# Patient Record
Sex: Male | Born: 1975 | Race: White | Hispanic: No | Marital: Married | Smoking: Current every day smoker
Health system: Southern US, Community
[De-identification: ages and names within clinical notes are randomized; demographics above are authoritative.]

## PROBLEM LIST (undated history)

## (undated) DIAGNOSIS — G4733 Obstructive sleep apnea (adult) (pediatric): Secondary | ICD-10-CM

## (undated) DIAGNOSIS — K219 Gastro-esophageal reflux disease without esophagitis: Secondary | ICD-10-CM

## (undated) DIAGNOSIS — J310 Chronic rhinitis: Secondary | ICD-10-CM

## (undated) DIAGNOSIS — Z720 Tobacco use: Secondary | ICD-10-CM

## (undated) DIAGNOSIS — M549 Dorsalgia, unspecified: Secondary | ICD-10-CM

## (undated) DIAGNOSIS — I10 Essential (primary) hypertension: Secondary | ICD-10-CM

## (undated) HISTORY — DX: Essential (primary) hypertension: I10

## (undated) HISTORY — DX: Tobacco use: Z72.0

## (undated) HISTORY — DX: Obstructive sleep apnea (adult) (pediatric): G47.33

## (undated) HISTORY — DX: Dorsalgia, unspecified: M54.9

## (undated) HISTORY — DX: Gastro-esophageal reflux disease without esophagitis: K21.9

## (undated) HISTORY — DX: Chronic rhinitis: J31.0

---

## 2002-01-30 ENCOUNTER — Encounter: Payer: Self-pay | Admitting: Emergency Medicine

## 2002-01-30 ENCOUNTER — Emergency Department (HOSPITAL_COMMUNITY): Admission: EM | Admit: 2002-01-30 | Discharge: 2002-01-30 | Payer: Self-pay | Admitting: Emergency Medicine

## 2005-11-09 ENCOUNTER — Ambulatory Visit: Payer: Self-pay | Admitting: Family Medicine

## 2005-12-09 ENCOUNTER — Ambulatory Visit: Payer: Self-pay | Admitting: Family Medicine

## 2010-05-22 ENCOUNTER — Ambulatory Visit: Payer: Self-pay | Admitting: Family Medicine

## 2010-05-22 DIAGNOSIS — J301 Allergic rhinitis due to pollen: Secondary | ICD-10-CM | POA: Insufficient documentation

## 2010-05-22 DIAGNOSIS — I10 Essential (primary) hypertension: Secondary | ICD-10-CM | POA: Insufficient documentation

## 2010-05-22 DIAGNOSIS — Z9189 Other specified personal risk factors, not elsewhere classified: Secondary | ICD-10-CM | POA: Insufficient documentation

## 2010-05-22 DIAGNOSIS — K219 Gastro-esophageal reflux disease without esophagitis: Secondary | ICD-10-CM | POA: Insufficient documentation

## 2010-05-23 LAB — CONVERTED CEMR LAB
Cholesterol: 203 mg/dL — ABNORMAL HIGH (ref 0–200)
Direct LDL: 141.3 mg/dL
Glucose, Bld: 95 mg/dL (ref 70–99)
HDL: 39.6 mg/dL (ref >39.00)
Total CHOL/HDL Ratio: 5
Triglycerides: 113 mg/dL (ref 0.0–149.0)
VLDL: 22.6 mg/dL (ref 0.0–40.0)

## 2010-09-09 NOTE — Assessment & Plan Note (Signed)
Summary: NEW PATIENT, EST / LFW   Vital Signs:  Patient profile:   35 year old male Height:      72 inches Weight:      264.25 pounds BMI:     35.97 Temp:     98.7 degrees F oral Pulse rate:   72 / minute Pulse rhythm:   regular BP sitting:   120 / 84  (left arm) Cuff size:   large  Vitals Entered By: Melody Comas (May 22, 2010 9:52 AM) CC: new patient   History of Present Illness: New patient. CPE.  See plan.    H/o possible OSA.  Prev ENT eval at [facility].  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  Needs new epipen rx.   FH of CV disease and needs glucose and chol.   Allergies (verified): 1)  ! * Bee Stings  Past History:  Past Medical History: GERD Hypertension- history of but off meds as of 2011  back pain- h/o ruptured disk, went through physical therapy  ~2005 h/o possible OSA, not on CPAP as of 2011 rhinitis, better with nasacort use  Past Surgical History: no surgical hx .  Family History: Reviewed history and no changes required. Family History Hypertension- Parents M alive F alive CVA, HTN GM with COPD, smoker 1 sister  Social History: Reviewed history and no changes required. Alcohol use-yes, occ, [employer] quit smoking 2010 Walking some for exercise From [facility], [employer] Works at [employer] since 2000 Married since 1999 No kids  Physical Exam  General:  GEN: nad, alert and oriented HEENT: mucous membranes moist NECK: supple w/o LA CV: rrr.  no murmur PULM: ctab, no inc wob ABD: soft, +bs EXT: no edema SKIN: no acute rash  17" neck   Impression & Recommendations:  Problem # 1:  Preventive Health Care (ICD-V70.0) healthy habits d/w patient.  Rec cutting out salt and fat.  Inc exercise.  Dec in weight.  Tdap <10 years per patient. Flu shot declined.  See notes on labs.  If snoring increases, then follow up here in clinic.  Not present consistently .  Problem # 2:  FAMILY HISTORY OF STROKE (ICD-V17.1) See notes on labs  and  above.  Orders: TLB-Lipid Panel ([id number])  Complete Medication List: 1)  Epipen 2-pak 0.3 Mg/0.6ml Devi (Epinephrine) .... Inject as directed. 2)  Nasacort Aq 55 Mcg/act Aers (Triamcinolone acetonide) .Marland Kitchen.. 1 spray per nostril per day  Other Orders: TLB-Glucose, QUANT ([id number])   Patient Instructions: 1)  Glad to see you.  Work on exercising more and cutting out salt.   2)  I sent your epipen to the pharmacy.  If you need it, use it and then immediately dial 911.   3)  Think about getting a flu shot.  4)  Let me know if the snoring gets worse.   5)  You can get your results through our phone system.  Follow the instructions on the blue card.  Prescriptions: EPIPEN 2-PAK 0.3 MG/0.3ML DEVI (EPINEPHRINE) inject as directed.  #1pack x 1   Entered and Authorized by:   Crawford Givens MD   Signed by:   Crawford Givens MD on 05/22/2010   Method used:   [facility] to        [facility] (retail)       [address]       [place], [place]  [address]       Ph: [phone] or [phone]  Fax: [phone]   RxID:   [id number]       Prior Medications (reviewed today): None Current Allergies (reviewed today): ! * BEE STINGS

## 2011-03-19 ENCOUNTER — Telehealth: Payer: Self-pay | Admitting: *Deleted

## 2011-03-19 MED ORDER — CIPROFLOXACIN HCL 500 MG PO TABS: 500.0000 mg | ORAL_TABLET | Freq: Two times a day (BID) | ORAL | Status: DC

## 2011-03-19 NOTE — Telephone Encounter (Signed)
Please call pt.  I sent the rx.

## 2011-03-19 NOTE — Telephone Encounter (Signed)
Patient is going to the [place] and is asking if he could get a rx for cipro for travelers diarrhea  in case he needs it. [facility].

## 2011-03-20 NOTE — Telephone Encounter (Signed)
Patient advised as instructed via telephone. 

## 2011-05-23 ENCOUNTER — Other Ambulatory Visit: Payer: Self-pay | Admitting: Family Medicine

## 2011-05-23 DIAGNOSIS — I10 Essential (primary) hypertension: Secondary | ICD-10-CM

## 2011-05-27 ENCOUNTER — Other Ambulatory Visit (INDEPENDENT_AMBULATORY_CARE_PROVIDER_SITE_OTHER): Payer: Self-pay

## 2011-05-27 DIAGNOSIS — I10 Essential (primary) hypertension: Secondary | ICD-10-CM

## 2011-05-27 LAB — GLUCOSE, RANDOM: Glucose, Bld: 96 mg/dL (ref 70–99)

## 2011-05-27 LAB — LIPID PANEL
Cholesterol: 213 mg/dL — ABNORMAL HIGH (ref 0–200)
HDL: 37.5 mg/dL — ABNORMAL LOW (ref >39.00)
Total CHOL/HDL Ratio: 6
Triglycerides: 155 mg/dL — ABNORMAL HIGH (ref 0.0–149.0)
VLDL: 31 mg/dL (ref 0.0–40.0)

## 2011-05-27 LAB — LDL CHOLESTEROL, DIRECT: Direct LDL: 153.9 mg/dL

## 2011-06-01 ENCOUNTER — Encounter: Payer: Self-pay | Admitting: Family Medicine

## 2011-06-01 ENCOUNTER — Ambulatory Visit (INDEPENDENT_AMBULATORY_CARE_PROVIDER_SITE_OTHER): Payer: 59 | Admitting: Family Medicine

## 2011-06-01 VITALS — BP 140/90 | HR 71 | Temp 98.2°F | Wt 264.1 lb

## 2011-06-01 DIAGNOSIS — IMO0001 Reserved for inherently not codable concepts without codable children: Secondary | ICD-10-CM

## 2011-06-01 DIAGNOSIS — Z23 Encounter for immunization: Secondary | ICD-10-CM

## 2011-06-01 DIAGNOSIS — Z720 Tobacco use: Secondary | ICD-10-CM

## 2011-06-01 DIAGNOSIS — I10 Essential (primary) hypertension: Secondary | ICD-10-CM | POA: Insufficient documentation

## 2011-06-01 DIAGNOSIS — Z0000 Encounter for general adult medical examination without abnormal findings: Secondary | ICD-10-CM

## 2011-06-01 DIAGNOSIS — Z87891 Personal history of nicotine dependence: Secondary | ICD-10-CM | POA: Insufficient documentation

## 2011-06-01 DIAGNOSIS — J301 Allergic rhinitis due to pollen: Secondary | ICD-10-CM

## 2011-06-01 MED ORDER — VARENICLINE TARTRATE 0.5 MG X 11 & 1 MG X 42 PO MISC: ORAL | Status: DC

## 2011-06-01 MED ORDER — EPINEPHRINE 0.3 MG/0.3ML IJ DEVI: INTRAMUSCULAR | Status: DC

## 2011-06-01 MED ORDER — FLUTICASONE PROPIONATE 50 MCG/ACT NA SUSP: 2.0000 | Freq: Every day | NASAL | Status: DC

## 2011-06-01 MED ORDER — VARENICLINE TARTRATE 1 MG PO TABS: 1.0000 mg | ORAL_TABLET | Freq: Two times a day (BID) | ORAL | Status: DC

## 2011-06-01 NOTE — Assessment & Plan Note (Signed)
Restart chantix, mood/dream cautions given.  He agrees.  Call back as needed.

## 2011-06-01 NOTE — Progress Notes (Signed)
CPE- See plan.  Routine anticipatory guidance given to patient.  See health maintenance.  Flu shot d/w pt.   Interested in getting off tobacco.  Some cigs, mostly dip.  Back on tobacco about 10 months.  First tobacco use after breakfast, when going to work.  1 can of dip a day.  Most likely to want it when at work.  "It keeps me calm."  Had prev quit.  Used chantix prev with relief.  Wants to use that again. Didn't have trouble with abnormal dreams (but he did have some vivid dreams); no mood changes.  No depressive sx.    H/o possible OSA.  Snoring some but not waking tired.  No napping, no nodding.  Weight unchanged.    H/o rhinitis, improved with nasal steroid. Needs refill.    PMH and SH reviewed  Meds, vitals, and allergies reviewed.   ROS: See HPI.  Otherwise negative.    GEN: nad, alert and oriented HEENT: mucous membranes moist, op w/o ulceration, nasal epithelium mildly injected NECK: supple w/o LA CV: rrr. PULM: ctab, no inc wob ABD: soft, +bs EXT: no edema SKIN: no acute rash

## 2011-06-01 NOTE — Assessment & Plan Note (Signed)
Restart nasal steroid and f/u prn. He agrees.

## 2011-06-01 NOTE — Assessment & Plan Note (Addendum)
tdap and flu today,  rx given for epipen.  Healthy habits encouraged.  D/w pt about diet/weight/exericse.  He wants to work on smoking first.  This is reasonable.  No indication for early colon/prostate CA screening.

## 2011-06-01 NOTE — Assessment & Plan Note (Signed)
Recheck BP 140/90, d/w pt about losing weight and diet/exericse.

## 2011-06-01 NOTE — Patient Instructions (Signed)
Try the chantix and let me know if you have concerns.  If the snoring gets worse, let me know.  Try to work on your weight after you stop smoking.  Glad to see you today.  Take care.

## 2011-12-08 ENCOUNTER — Telehealth: Payer: Self-pay

## 2011-12-08 NOTE — Telephone Encounter (Signed)
Pt seen UC across from [facility] for poison oak; given injection on 12/04/11. Pt BP 178/112. Pt said no h/a, no dizziness, no SOB, no chest pain. Pt only takes Zyrtec routinely. Pt feels normal now; came by after to work to get Dr Lianne Bushy advice. Dr Para March said ck BP now. BP 178/112 with large cuff now. Dr Para March said pt to get BP cuff and Wed and Thur when pt gets up take BP, urinate, rest and recheck BP. Pt to keep log and schedule appt with Dr Para March Friday 12/11/11 at 10:45 am.  If pt has any symptoms or does not feel normal  pt to notify Dr Para March and if at night go to urgent care or ER. Pt acknowledges instructions.

## 2011-12-09 NOTE — Telephone Encounter (Signed)
Agreed -

## 2011-12-11 ENCOUNTER — Encounter: Payer: Self-pay | Admitting: Family Medicine

## 2011-12-11 ENCOUNTER — Ambulatory Visit (INDEPENDENT_AMBULATORY_CARE_PROVIDER_SITE_OTHER): Payer: 59 | Admitting: Family Medicine

## 2011-12-11 VITALS — BP 152/100 | HR 80 | Temp 98.2°F | Wt 266.8 lb

## 2011-12-11 DIAGNOSIS — I10 Essential (primary) hypertension: Secondary | ICD-10-CM

## 2011-12-11 DIAGNOSIS — M25569 Pain in unspecified knee: Secondary | ICD-10-CM

## 2011-12-11 LAB — BASIC METABOLIC PANEL
BUN: 14 mg/dL (ref 6–23)
Chloride: 104 mEq/L (ref 96–112)
Creatinine, Ser: 0.9 mg/dL (ref 0.4–1.5)
Glucose, Bld: 97 mg/dL (ref 70–99)
Potassium: 4.6 mEq/L (ref 3.5–5.1)

## 2011-12-11 MED ORDER — HYDROCHLOROTHIAZIDE 12.5 MG PO TABS: 12.5000 mg | ORAL_TABLET | Freq: Every day | ORAL | Status: DC

## 2011-12-11 NOTE — Progress Notes (Signed)
Hypertension:    Not on meds BP elevated on mult checks Chest pain with exertion: no Edema:no Short of breath:no H/o OSA, not on CPAP.  Snoring more.  Waking up tired occ.  No AM HA.  No napping during the day.   He's working on diet but he isn't getting much exercise out of work.  He does have a physical job, but exercise limited o/w.   Weight is up 2 lbs in last 6 months.   We discussed salt load in food.   FH HTN.   He had stepped in a hole a few days ago, had some L knee pain but this is much improved, able to bear weight.    Meds, vitals, and allergies reviewed.   ROS: See HPI.  Otherwise negative.    GEN: nad, alert and oriented HEENT: mucous membranes moist NECK: supple w/o LA CV: rrr. PULM: ctab, no inc wob ABD: soft, +bs EXT: no edema SKIN: no acute rash L knee with normal inspection, not ttp on joint line and joint feels stable on testing ligaments.

## 2011-12-11 NOTE — Patient Instructions (Signed)
Go to the lab on the way out.  We'll contact you with your lab report. Start the HCTZ, 1 a day.   I'd like to see you back in a month, sooner if needed.   Look up the american heart association and the DASH diet.   Take care.

## 2011-12-14 DIAGNOSIS — M25562 Pain in left knee: Secondary | ICD-10-CM | POA: Insufficient documentation

## 2011-12-14 NOTE — Assessment & Plan Note (Signed)
D/w pt about path/phys.  Check bmet, start HCTZ, work on weight and diet. Recheck in 1 month.  D/wpt about OSA and HTN/weight.

## 2011-12-14 NOTE — Assessment & Plan Note (Signed)
Will follow, benign exam.

## 2012-01-12 ENCOUNTER — Ambulatory Visit (INDEPENDENT_AMBULATORY_CARE_PROVIDER_SITE_OTHER): Payer: 59 | Admitting: Family Medicine

## 2012-01-12 ENCOUNTER — Encounter: Payer: Self-pay | Admitting: Family Medicine

## 2012-01-12 VITALS — BP 148/86 | HR 86 | Temp 98.6°F | Wt 260.0 lb

## 2012-01-12 DIAGNOSIS — I10 Essential (primary) hypertension: Secondary | ICD-10-CM

## 2012-01-12 NOTE — Progress Notes (Signed)
Hypertension:    Using medication without problems or lightheadedness: yes Chest pain with exertion:no Edema:no Short of breath:no Average home BPs: not checked.   Other issues: he's working on his diet.  Packing his lunch, staying away from fatty foods.  Down 6 lbs.  Exercise at work continues.   Tolerating HCTZ w/o ADE.  We talked about not getting dehydrated at work.   He's trying to exercise, walk, outside of work.   Knee is improved from prev.    Meds, vitals, and allergies reviewed.   ROS: See HPI.  Otherwise negative.    GEN: nad, alert and oriented HEENT: mucous membranes moist NECK: supple w/o LA CV: rrr. PULM: ctab, no inc wob ABD: soft, +bs EXT: no edema SKIN: no acute rash

## 2012-01-12 NOTE — Assessment & Plan Note (Signed)
Improved, continue work on diet and weight.  Continue HCTZ.  F/u with me this fall.  He agrees.

## 2012-01-12 NOTE — Patient Instructions (Signed)
Don't change your meds and get a physical this fall/winter.  Keep working on [employer] and exercise.

## 2012-06-03 ENCOUNTER — Encounter: Payer: Self-pay | Admitting: Family Medicine

## 2012-06-03 ENCOUNTER — Ambulatory Visit (INDEPENDENT_AMBULATORY_CARE_PROVIDER_SITE_OTHER): Payer: 59 | Admitting: Family Medicine

## 2012-06-03 VITALS — BP 132/88 | HR 79 | Temp 98.0°F | Ht 75.0 in | Wt 261.0 lb

## 2012-06-03 DIAGNOSIS — Z23 Encounter for immunization: Secondary | ICD-10-CM

## 2012-06-03 DIAGNOSIS — Z0000 Encounter for general adult medical examination without abnormal findings: Secondary | ICD-10-CM

## 2012-06-03 DIAGNOSIS — I10 Essential (primary) hypertension: Secondary | ICD-10-CM

## 2012-06-03 LAB — COMPREHENSIVE METABOLIC PANEL
Alkaline Phosphatase: 50 U/L (ref 39–117)
CO2: 31 mEq/L (ref 19–32)
Creatinine, Ser: 1 mg/dL (ref 0.4–1.5)
GFR: 86.11 mL/min (ref >60.00)
Glucose, Bld: 87 mg/dL (ref 70–99)
Total Bilirubin: 0.6 mg/dL (ref 0.3–1.2)

## 2012-06-03 LAB — LIPID PANEL: Total CHOL/HDL Ratio: 6

## 2012-06-03 NOTE — Assessment & Plan Note (Signed)
Routine anticipatory guidance given to patient. See health maintenance.  Tetanus 2012  Flu shot 2013  No indication for colon/prostate cancer screening.  Diet discussed- bringing his lunch to work. He's working on diet. He'll inc exercise out of work.  Living will discussed, encouraged.  Tobacco cessation encouraged.  Check routine htn labs today.

## 2012-06-03 NOTE — Progress Notes (Signed)
CPE- See plan.  Routine anticipatory guidance given to patient.  See health maintenance. Tetanus 2012 Flu shot 2013 No indication for colon/prostate cancer screening.  Diet discussed- bringing his lunch to work.  He's working on diet.  He'll inc exercise out of work.   Living will discussed, encouraged.    Hypertension:    Using medication without problems or lightheadedness: yes Chest pain with exertion:no Edema:no Short of breath:no No ADE from HCTZ. Fasting.    PMH and SH reviewed  Meds, vitals, and allergies reviewed.   ROS: See HPI.  Otherwise negative.    GEN: nad, alert and oriented HEENT: mucous membranes moist NECK: supple w/o LA CV: rrr. PULM: ctab, no inc wob ABD: soft, +bs EXT: no edema SKIN: no acute rash

## 2012-06-03 NOTE — Patient Instructions (Addendum)
Go to the lab on the way out.  We'll contact you with your lab report. Keep working on your weight and recheck in 1 year.  Take care.

## 2012-06-05 NOTE — Assessment & Plan Note (Signed)
Check labs today, no change in meds.  D/w pt about weight.  He agrees.

## 2012-06-06 ENCOUNTER — Encounter: Payer: Self-pay | Admitting: *Deleted

## 2012-06-16 ENCOUNTER — Other Ambulatory Visit: Payer: Self-pay | Admitting: Family Medicine

## 2012-12-20 ENCOUNTER — Other Ambulatory Visit: Payer: Self-pay | Admitting: Family Medicine

## 2012-12-21 NOTE — Telephone Encounter (Signed)
Received refill request electronically.  Last office visit 06/03/12. Is it okay to refill medication?

## 2012-12-22 NOTE — Telephone Encounter (Signed)
Yes, sent

## 2013-05-29 ENCOUNTER — Telehealth: Payer: Self-pay | Admitting: Family Medicine

## 2013-05-29 NOTE — Telephone Encounter (Signed)
Patient needs CPX by the end of November, and you don't have an opening until February of next year.  He wants to know if you can work him in before that time.

## 2013-05-29 NOTE — Telephone Encounter (Signed)
See if you can add him on for a slot, not on Mondays or Fridays.  Thanks.

## 2013-05-30 NOTE — Telephone Encounter (Signed)
Called patient and set up appt for phy on 06/07/13 at 3:00pm.

## 2013-05-31 ENCOUNTER — Other Ambulatory Visit: Payer: Self-pay | Admitting: Family Medicine

## 2013-05-31 ENCOUNTER — Other Ambulatory Visit (INDEPENDENT_AMBULATORY_CARE_PROVIDER_SITE_OTHER): Payer: 59

## 2013-05-31 DIAGNOSIS — Z0000 Encounter for general adult medical examination without abnormal findings: Secondary | ICD-10-CM

## 2013-05-31 DIAGNOSIS — I10 Essential (primary) hypertension: Secondary | ICD-10-CM

## 2013-05-31 LAB — COMPREHENSIVE METABOLIC PANEL
ALT: 30 U/L (ref 0–53)
Albumin: 4 g/dL (ref 3.5–5.2)
Alkaline Phosphatase: 61 U/L (ref 39–117)
CO2: 31 mEq/L (ref 19–32)
GFR: 94.03 mL/min (ref >60.00)
Glucose, Bld: 98 mg/dL (ref 70–99)
Potassium: 4.1 mEq/L (ref 3.5–5.1)
Sodium: 142 mEq/L (ref 135–145)
Total Protein: 6.4 g/dL (ref 6.0–8.3)

## 2013-05-31 LAB — LIPID PANEL
Cholesterol: 200 mg/dL (ref 0–200)
LDL Cholesterol: 132 mg/dL — ABNORMAL HIGH (ref 0–99)
VLDL: 33.4 mg/dL (ref 0.0–40.0)

## 2013-06-07 ENCOUNTER — Encounter: Payer: Self-pay | Admitting: Family Medicine

## 2013-06-07 ENCOUNTER — Ambulatory Visit (INDEPENDENT_AMBULATORY_CARE_PROVIDER_SITE_OTHER): Payer: 59 | Admitting: Family Medicine

## 2013-06-07 VITALS — BP 136/86 | HR 88 | Temp 98.5°F | Ht 73.5 in | Wt 255.8 lb

## 2013-06-07 DIAGNOSIS — Z23 Encounter for immunization: Secondary | ICD-10-CM

## 2013-06-07 DIAGNOSIS — J301 Allergic rhinitis due to pollen: Secondary | ICD-10-CM

## 2013-06-07 DIAGNOSIS — I10 Essential (primary) hypertension: Secondary | ICD-10-CM

## 2013-06-07 DIAGNOSIS — Z0000 Encounter for general adult medical examination without abnormal findings: Secondary | ICD-10-CM

## 2013-06-07 DIAGNOSIS — IMO0002 Reserved for concepts with insufficient information to code with codable children: Secondary | ICD-10-CM

## 2013-06-07 MED ORDER — FLUTICASONE PROPIONATE 50 MCG/ACT NA SUSP: NASAL | Status: DC

## 2013-06-07 NOTE — Progress Notes (Signed)
CPE- See plan.  Routine anticipatory guidance given to patient.  See health maintenance. Tetanus 2012 Flu shot today.  Colon and prostate cancer screening not due.  Living will d/w pt.  Would have wife designated if incapacitated.   Diet and exercise, encouraged both.  Taking his lunch at work.  Cutting back on soda.  Slow taper on weight.  Walking for exercise.   Discussed tobacco cessation.    Rhinitis controlled with prn flonase.    Hypertension:   Using medication without problems or lightheadedness: yes Chest pain with exertion:no Edema:no Short of breath:no  PMH and SH reviewed  Meds, vitals, and allergies reviewed.   ROS: See HPI.  Otherwise negative.    GEN: nad, alert and oriented HEENT: mucous membranes moist NECK: supple w/o LA CV: rrr. PULM: ctab, no inc wob ABD: soft, +bs EXT: no edema SKIN: no acute rash

## 2013-06-07 NOTE — Patient Instructions (Signed)
Don't change your meds.  Keep working on diet and exercise.  Avoid sweets.  Take care.  Glad to see you.

## 2013-06-08 DIAGNOSIS — E669 Obesity, unspecified: Secondary | ICD-10-CM | POA: Insufficient documentation

## 2013-06-08 NOTE — Assessment & Plan Note (Signed)
Routine anticipatory guidance given to patient.  See health maintenance. Tetanus 2012 Flu shot today.  Colon and prostate cancer screening not due.  Living will d/w pt.  Would have wife designated if incapacitated.   Diet and exercise, encouraged both.  Taking his lunch at work.  Cutting back on soda.  Slow taper on weight.  Walking for exercise.   Discussed tobacco cessation.

## 2013-06-08 NOTE — Assessment & Plan Note (Signed)
Discussed weight loss with diet and exercise

## 2013-06-08 NOTE — Assessment & Plan Note (Signed)
Discussed diet and exercise, continue current meds.  Labs d/w pt.  Controlled.

## 2013-06-08 NOTE — Assessment & Plan Note (Signed)
Continue current meds 

## 2014-01-05 ENCOUNTER — Other Ambulatory Visit: Payer: Self-pay | Admitting: Family Medicine

## 2014-02-04 ENCOUNTER — Other Ambulatory Visit: Payer: Self-pay | Admitting: Family Medicine

## 2014-03-06 ENCOUNTER — Encounter: Payer: Self-pay | Admitting: Family Medicine

## 2014-03-06 ENCOUNTER — Ambulatory Visit (INDEPENDENT_AMBULATORY_CARE_PROVIDER_SITE_OTHER): Payer: 59 | Admitting: Family Medicine

## 2014-03-06 VITALS — BP 140/96 | HR 79 | Temp 98.0°F | Ht 75.0 in | Wt 268.0 lb

## 2014-03-06 DIAGNOSIS — I10 Essential (primary) hypertension: Secondary | ICD-10-CM

## 2014-03-06 DIAGNOSIS — Z0000 Encounter for general adult medical examination without abnormal findings: Secondary | ICD-10-CM

## 2014-03-06 DIAGNOSIS — Z7189 Other specified counseling: Secondary | ICD-10-CM

## 2014-03-06 DIAGNOSIS — J301 Allergic rhinitis due to pollen: Secondary | ICD-10-CM

## 2014-03-06 LAB — LDL CHOLESTEROL, DIRECT: Direct LDL: 156.4 mg/dL

## 2014-03-06 LAB — COMPREHENSIVE METABOLIC PANEL
ALT: 33 U/L (ref 0–53)
AST: 22 U/L (ref 0–37)
Albumin: 4 g/dL (ref 3.5–5.2)
Alkaline Phosphatase: 62 U/L (ref 39–117)
BUN: 12 mg/dL (ref 6–23)
CHLORIDE: 106 meq/L (ref 96–112)
CO2: 30 meq/L (ref 19–32)
Calcium: 9.3 mg/dL (ref 8.4–10.5)
Creatinine, Ser: 1 mg/dL (ref 0.4–1.5)
GFR: 89.3 mL/min (ref >60.00)
GLUCOSE: 100 mg/dL — AB (ref 70–99)
POTASSIUM: 3.9 meq/L (ref 3.5–5.1)
Sodium: 141 mEq/L (ref 135–145)
Total Bilirubin: 0.6 mg/dL (ref 0.2–1.2)
Total Protein: 6.8 g/dL (ref 6.0–8.3)

## 2014-03-06 LAB — LIPID PANEL
Cholesterol: 221 mg/dL — ABNORMAL HIGH (ref 0–200)
HDL: 35.4 mg/dL — AB (ref >39.00)
NonHDL: 185.6
TRIGLYCERIDES: 201 mg/dL — AB (ref 0.0–149.0)
Total CHOL/HDL Ratio: 6
VLDL: 40.2 mg/dL — ABNORMAL HIGH (ref 0.0–40.0)

## 2014-03-06 MED ORDER — FLUTICASONE PROPIONATE 50 MCG/ACT NA SUSP: NASAL | Status: AC

## 2014-03-06 MED ORDER — HYDROCHLOROTHIAZIDE 12.5 MG PO CAPS: ORAL_CAPSULE | ORAL | Status: DC

## 2014-03-06 NOTE — Assessment & Plan Note (Signed)
He'll monitor and if consistently elevated, then he'll notify me.  See notes on labs.  He agrees.  D/w pt about his weight.  He is working to stop smoking with nicotine replacement.

## 2014-03-06 NOTE — Patient Instructions (Signed)
Check your BP out of clinic a few times.  If consistently >140/>90 then let me know.  Keep working on stopping smoking.  Go to the lab on the way out.  We'll contact you with your lab report. I would get a flu shot each fall.   Take care.  Try to exercise more. Glad to see you.

## 2014-03-06 NOTE — Assessment & Plan Note (Signed)
Continue flonase, is not smoking now.

## 2014-03-06 NOTE — Progress Notes (Signed)
Pre visit review using our clinic review tool, if applicable. No additional management support is needed unless otherwise documented below in the visit note.  CPE- See plan.  Routine anticipatory guidance given to patient.  See health maintenance. Tetanus 2012 Flu 2014 PNA and shingles shot not due.   Prostate and colon cancer screening not due.  Diet and exercise. Taking his lunch.  Healthy diet.  Cutting out on sweets.  Doing better with diet than with exercise.   Working to stop smoking.  On nicotine replacement.   Living will d/w pt.  Wife would be designated if patient were incapacitated.    Hypertension:    Using medication without problems or lightheadedness: yes Chest pain with exertion:no Edema:no Short of breath:no Average home BPs:not checked.    PMH and SH reviewed  Meds, vitals, and allergies reviewed.   ROS: See HPI.  Otherwise negative.    GEN: nad, alert and oriented HEENT: mucous membranes moist NECK: supple w/o LA CV: rrr. 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 Neither has changed recently and neither needs intervention now.  D/w pt.

## 2014-03-06 NOTE — Assessment & Plan Note (Signed)
Routine anticipatory guidance given to patient. See health maintenance.  Tetanus 2012  Flu 2014  PNA and shingles shot not due.  Prostate and colon cancer screening not due.  Diet and exercise. Taking his lunch. Healthy diet. Cutting out on sweets. Doing better with diet than with exercise.  Working to stop smoking. On nicotine replacement.  Living will d/w pt. Wife would be designated if patient were incapacitated.

## 2014-04-29 ENCOUNTER — Other Ambulatory Visit: Payer: Self-pay | Admitting: Family Medicine

## 2015-02-24 ENCOUNTER — Other Ambulatory Visit: Payer: Self-pay | Admitting: Family Medicine

## 2015-02-24 DIAGNOSIS — I10 Essential (primary) hypertension: Secondary | ICD-10-CM

## 2015-03-04 ENCOUNTER — Other Ambulatory Visit (INDEPENDENT_AMBULATORY_CARE_PROVIDER_SITE_OTHER): Payer: 59

## 2015-03-04 DIAGNOSIS — I10 Essential (primary) hypertension: Secondary | ICD-10-CM

## 2015-03-04 LAB — BASIC METABOLIC PANEL
BUN: 11 mg/dL (ref 6–23)
CALCIUM: 9 mg/dL (ref 8.4–10.5)
CHLORIDE: 104 meq/L (ref 96–112)
CO2: 29 mEq/L (ref 19–32)
Creatinine, Ser: 0.94 mg/dL (ref 0.40–1.50)
GFR: 94.33 mL/min (ref >60.00)
GLUCOSE: 96 mg/dL (ref 70–99)
Potassium: 3.3 mEq/L — ABNORMAL LOW (ref 3.5–5.1)
Sodium: 142 mEq/L (ref 135–145)

## 2015-03-04 LAB — LIPID PANEL
CHOL/HDL RATIO: 6
CHOLESTEROL: 227 mg/dL — AB (ref 0–200)
HDL: 37.6 mg/dL — ABNORMAL LOW (ref >39.00)
NonHDL: 189.4
Triglycerides: 224 mg/dL — ABNORMAL HIGH (ref 0.0–149.0)
VLDL: 44.8 mg/dL — ABNORMAL HIGH (ref 0.0–40.0)

## 2015-03-04 LAB — LDL CHOLESTEROL, DIRECT: Direct LDL: 158 mg/dL

## 2015-03-08 ENCOUNTER — Encounter: Payer: Self-pay | Admitting: Family Medicine

## 2015-03-08 ENCOUNTER — Encounter (INDEPENDENT_AMBULATORY_CARE_PROVIDER_SITE_OTHER): Payer: Self-pay

## 2015-03-08 ENCOUNTER — Ambulatory Visit (INDEPENDENT_AMBULATORY_CARE_PROVIDER_SITE_OTHER): Payer: 59 | Admitting: Family Medicine

## 2015-03-08 VITALS — BP 162/100 | HR 87 | Temp 98.3°F | Ht 75.0 in | Wt 263.5 lb

## 2015-03-08 DIAGNOSIS — L237 Allergic contact dermatitis due to plants, except food: Secondary | ICD-10-CM | POA: Insufficient documentation

## 2015-03-08 DIAGNOSIS — I10 Essential (primary) hypertension: Secondary | ICD-10-CM | POA: Diagnosis not present

## 2015-03-08 DIAGNOSIS — Z0000 Encounter for general adult medical examination without abnormal findings: Secondary | ICD-10-CM

## 2015-03-08 MED ORDER — TRIAMCINOLONE ACETONIDE 0.5 % EX CREA
1.0000 | TOPICAL_CREAM | Freq: Three times a day (TID) | CUTANEOUS | Status: DC
Start: 2015-03-08 — End: 2015-04-12

## 2015-03-08 MED ORDER — HYDROCHLOROTHIAZIDE 12.5 MG PO CAPS
ORAL_CAPSULE | ORAL | Status: DC
Start: 2015-03-08 — End: 2015-04-03

## 2015-03-08 MED ORDER — PREDNISONE 20 MG PO TABS: ORAL_TABLET | ORAL | Status: DC

## 2015-03-08 NOTE — Assessment & Plan Note (Signed)
Routine anticipatory guidance given to patient. See health maintenance.  Tetanus 2012  Flu prev done.  PNA and shingles shot not due.  Prostate and colon cancer screening not due.  Diet and exercise. Taking his lunch. Healthy diet. Cutting out on sweets. Doing better with diet than with exercise. He had been on an exercise bike but that ended with the recent weather change. He'll work on getting back on the bike.  Working to stop dipping. On nicotine replacement.  Living will d/w pt. Wife would be designated if patient were incapacitated.

## 2015-03-08 NOTE — Assessment & Plan Note (Signed)
No change in meds yet, he'll check BP at home and update me.  Continue as is. . Labs d/w pt.  Needs work on diet and exercise.

## 2015-03-08 NOTE — Patient Instructions (Signed)
Recheck your pressure at home and update me next week.  We may need to add a medicine.  Goal weight loss: 1 lb per month.  Use the cream then the prednisone if needed.  Take it with food.  Take care.  Glad to see you.  I would get a flu shot each fall.

## 2015-03-08 NOTE — Progress Notes (Signed)
Pre visit review using our clinic review tool, if applicable. No additional management support is needed unless otherwise documented below in the visit note.  CPE- See plan.  Routine anticipatory guidance given to patient.  See health maintenance. Tetanus 2012 Flu prev done.   PNA and shingles shot not due.  Prostate and colon cancer screening not due.  Diet and exercise. Taking his lunch. Healthy diet. Cutting out on sweets. Doing better with diet than with exercise. He had been on an exercise bike but that ended with the recent weather change.  He'll work on getting back on the bike.   Working to stop dipping. On nicotine replacement.  Living will d/w pt. Wife would be designated if patient were incapacitated.   Hypertension:  Using medication without problems or lightheadedness: yes Chest pain with exertion:no Edema:no Short of breath:no Average home BPs:not checked recently.   Rash on L shin.  Likely poison ivy exposure.  Spreading in the meantime.  Burns more than itching.  Painful.  Using topical cortisone.    PMH and SH reviewed  Meds, vitals, and allergies reviewed.   ROS: See HPI.  Otherwise negative.    GEN: nad, alert and oriented HEENT: mucous membranes moist NECK: supple w/o LA CV: rrr. PULM: ctab, no inc wob ABD: soft, +bs EXT: no edema SKIN: large (30 cm) blistered patch from rhus dermatitis on the L shin.  Doesn't appear infected.

## 2015-03-08 NOTE — Assessment & Plan Note (Signed)
Doesn't appear infected.  Large patch, can use TAC initially then pred by mouth if needed.  Routine cautions given.  He agrees.

## 2015-03-30 ENCOUNTER — Other Ambulatory Visit: Payer: Self-pay | Admitting: Family Medicine

## 2015-04-01 NOTE — Telephone Encounter (Signed)
Pt 7/29 ov pt was to call with an update on his bp readings. I didn't see any record of a call from pt, so I called to pt for info in bp readings. I didn't reach pt, but left message on his cell for him to return call to office.

## 2015-04-02 NOTE — Telephone Encounter (Signed)
Patient states he received the VM yesterday and tried to phone in 3 or 4 times with no answer.  Patient states his BP readings are not good...ie in the 170's/100's readings.  Please advise.

## 2015-04-03 MED ORDER — HYDROCHLOROTHIAZIDE 25 MG PO TABS: 25.0000 mg | ORAL_TABLET | Freq: Every day | ORAL | Status: DC

## 2015-04-03 NOTE — Telephone Encounter (Signed)
Left message for patient to return call.

## 2015-04-03 NOTE — Telephone Encounter (Signed)
Up the dose to 25mg  a day.  New rx sent.   Can take 12.5mg  x2 per day in meantime to use up what he has.   Needs recheck here in the office next week.   Thanks.

## 2015-04-03 NOTE — Telephone Encounter (Signed)
Patient notified.  Follow up scheduled in 1 week 04/12/15 at 0815.

## 2015-04-12 ENCOUNTER — Encounter: Payer: Self-pay | Admitting: Family Medicine

## 2015-04-12 ENCOUNTER — Ambulatory Visit (INDEPENDENT_AMBULATORY_CARE_PROVIDER_SITE_OTHER): Payer: 59 | Admitting: Family Medicine

## 2015-04-12 VITALS — BP 120/80 | HR 85 | Wt 269.0 lb

## 2015-04-12 DIAGNOSIS — I10 Essential (primary) hypertension: Secondary | ICD-10-CM | POA: Diagnosis not present

## 2015-04-12 DIAGNOSIS — Z9852 Vasectomy status: Secondary | ICD-10-CM

## 2015-04-12 DIAGNOSIS — IMO0001 Reserved for inherently not codable concepts without codable children: Secondary | ICD-10-CM

## 2015-04-12 DIAGNOSIS — Z119 Encounter for screening for infectious and parasitic diseases, unspecified: Secondary | ICD-10-CM | POA: Diagnosis not present

## 2015-04-12 NOTE — Assessment & Plan Note (Addendum)
Improved, he'll check BP a few times out of clinic.  Update me as needed.  No change in meds.   D/w pt about weight loss.  He'll get flu shot at work.

## 2015-04-12 NOTE — Patient Instructions (Addendum)
Rosaria Ferries will call about your referral. Check your BP a few times out of clinic.  Get your flu shot at work.   Take care.  Glad to see you.

## 2015-04-12 NOTE — Progress Notes (Signed)
Pre visit review using our clinic review tool, if applicable. No additional management support is needed unless otherwise documented below in the visit note.  Hypertension:  Using medication without problems or lightheadedness: yes Chest pain with exertion:no Edema:no Short of breath:no Average home BPs: not checked at home.  On higher dose of HCTZ.  D/w pt about weight loss.   Recheck BP 140/80.  This was improved from prev baseline.    Pt opts in for HIV screening with next set of labs.  D/w pt re: routine screening.    Wanted a vasectomy.  D/w pt.    Meds, vitals, and allergies reviewed.   PMH and SH reviewed  ROS: See HPI.  Otherwise negative.    GEN: nad, alert and oriented HEENT: mucous membranes moist NECK: supple w/o LA CV: rrr. PULM: ctab, no inc wob ABD: soft, +bs EXT: no edema

## 2015-04-12 NOTE — Assessment & Plan Note (Signed)
Pt opts in for HIV screening with next set of labs. D/w pt re: routine screening.

## 2015-04-12 NOTE — Assessment & Plan Note (Signed)
Refer

## 2015-07-11 HISTORY — PX: VASECTOMY: SHX75

## 2015-09-12 ENCOUNTER — Telehealth: Payer: Self-pay

## 2015-09-12 ENCOUNTER — Ambulatory Visit
Admission: RE | Admit: 2015-09-12 | Discharge: 2015-09-12 | Disposition: A | Discharge status: Home / Self Care | Payer: 59 | Source: Ambulatory Visit | Attending: Family Medicine | Admitting: Family Medicine

## 2015-09-12 ENCOUNTER — Encounter: Payer: Self-pay | Admitting: Family Medicine

## 2015-09-12 ENCOUNTER — Ambulatory Visit (INDEPENDENT_AMBULATORY_CARE_PROVIDER_SITE_OTHER): Payer: 59 | Admitting: Family Medicine

## 2015-09-12 VITALS — BP 148/98 | HR 94 | Temp 98.3°F | Wt 268.2 lb

## 2015-09-12 DIAGNOSIS — N5089 Other specified disorders of the male genital organs: Secondary | ICD-10-CM | POA: Diagnosis not present

## 2015-09-12 LAB — POC URINALSYSI DIPSTICK (AUTOMATED)
Bilirubin, UA: NEGATIVE
GLUCOSE UA: NEGATIVE
Ketones, UA: NEGATIVE
NITRITE UA: NEGATIVE
Protein, UA: 30
Spec Grav, UA: 1.025
UROBILINOGEN UA: 0.2
pH, UA: 6

## 2015-09-12 MED ORDER — LEVOFLOXACIN 500 MG PO TABS: 500.0000 mg | ORAL_TABLET | Freq: Every day | ORAL | Status: DC

## 2015-09-12 NOTE — Patient Instructions (Addendum)
Urine checked today Ultrasound today - pass by our referral coordinators to schedule this morning. We will call you with results.  Orchitis Orchitis is swelling (inflammation) of a testicle caused by infection. Testicles are the male organs that produce sperm. The testicles are held in a fleshy sac (scrotum) located behind the penis. Orchitis usually affects only one testicle, but it can occur in both. The condition can develop suddenly. Orchitis can be caused by many different kinds of bacteria and viruses. CAUSES Orchitis can be caused by either a bacterial or viral infection. Bacterial Infections  These often occur along with an infection of the coiled tube that collects sperm and sits on top of the testicle (epididymis).  In men who are not sexually active, bacterial orchitis usually starts as a urinary tract infection and spreads to the testicle.  In sexually active men, sexually transmitted infections are the most common cause of bacterial orchitis. These can include:  Gonorrhea.  Chlamydia. Viral Infections  Mumps is still the most common cause of viral orchitis, though mumps is now rare in many areas because of vaccination.  Other viruses that can cause orchitis include:  The chickenpox virus (varicella-zoster virus).  The virus that causes mononucleosis (Epstein-Barr virus). RISK FACTORS Boys and men who have not been vaccinated against mumps are at risk for mumps orchitis. Risk factors for bacterial orchitis include:  Frequent urinary tract infections.  High-risk sexual behaviors.  Having a sexual partner with a sexually transmitted infection.  Having had urinary tract surgery.  Using a tube passed through the penis to drain urine (Foley catheter).  An enlarged prostate gland. SIGNS AND SYMPTOMS The most common symptoms of orchitis are swelling and pain in the scrotum. Other signs and symptoms may include:  Feeling generally sick (malaise).  Fever and  chills.  Painful urination.  Painful ejaculation.  Blood or discharge from the penis.  Nausea.  Headache.  Fatigue. DIAGNOSIS Your health care provider may suspect orchitis if you have a painful, swollen testicle along with other signs and symptoms of the condition. A physical exam will be done. Tests may also be done to help your health care provider make a diagnosis. These may include:  A blood test to check for signs of infection.  A urine test to check for a urinary tract infection.  Using a swab to collect a fluid sample from the tip of the penis to test for sexually transmitted infections.  Taking an image of the testicle using sound waves and a computer (testicular ultrasound). TREATMENT Treatment of orchitis depends on the cause. For orchitis caused by a bacterial infection, your health care provider will most likely prescribe antibiotic medicines. Bacterial infections usually clear up within a few days. Both viral infections and bacterial infections may be treated with:  Bed rest.  Anti-inflammatory medicines.  Pain medicines.  Elevating the scrotum and applying ice. HOME CARE INSTRUCTIONS  Rest as directed by your health care provider.  Take medicines only as directed by your health care provider.  If you were prescribed an antibiotic medicine, finish it all even if you start to feel better.  Elevate your scrotum and apply ice as directed:  Put ice in a plastic bag.  Place a small towel or pillow between your legs.  Rest your scrotum on the pillow or towel.  Place another towel between your skin and the plastic bag.  Leave the ice on for 20 minutes, 2-3 times a day. SEEK MEDICAL CARE IF:  You have a  fever.  Pain and swelling have not gotten better after 3 days. SEEK IMMEDIATE MEDICAL CARE IF:  Your pain is getting worse.  The swelling in your testicle gets worse.   This information is not intended to replace advice given to you by your health  care provider. Make sure you discuss any questions you have with your health care provider.   Document Released: 07/24/2000 Document Revised: 08/17/2014 Document Reviewed: 12/14/2013 Elsevier Interactive Patient Education [employer].

## 2015-09-12 NOTE — Addendum Note (Signed)
Addended by: Ria Bush on: 09/12/2015 09:36 AM   Modules accepted: Orders

## 2015-09-12 NOTE — Telephone Encounter (Signed)
See result note.  

## 2015-09-12 NOTE — Addendum Note (Signed)
Addended by: Ria Bush on: 09/12/2015 09:45 AM   Modules accepted: Orders

## 2015-09-12 NOTE — Progress Notes (Signed)
Pre visit review using our clinic review tool, if applicable. No additional management support is needed unless otherwise documented below in the visit note. 

## 2015-09-12 NOTE — Telephone Encounter (Signed)
Levada Dy with [facility] called report and pt is not waiting but Levada Dy request cb to pt today due to possible abscess. Report taken to Dr Darnell Level.

## 2015-09-12 NOTE — Assessment & Plan Note (Addendum)
4d h/o painful swollen testicle. No LAD appreciated, no hernias appreciated. Concern for infectious vs non infectious epididymo-orchitis - but will check STAT testicular US to help r/o mass, hernia, torsion.  UA today. Will call with results. Abnormal UA - start levaquin 500mg  daily x 10 days for likely epididymo orchitis. Update if not improving within 2-3 days.

## 2015-09-12 NOTE — Progress Notes (Addendum)
BP 148/98 mmHg  Pulse 94  Temp(Src) 98.3 F (36.8 C) (Oral)  Wt 268 lb 4 oz (121.677 kg)  SpO2 98%   CC: groin pain/swelling  Subjective:    Patient ID: Kent Landry, male    DOB: 08-19-1975, 40 y.o.   MRN: [id number]  HPI: Kent Landry is a 40 y.o. male presenting on 09/12/2015 for Groin Pain   Scrotum swelling and pain started 4d ago. Stayed home from work, took hydrocodone for pain and slept. Some improvement over 2 days, but yesterday and today L testicle pain worsening.  Denies dysuria, fevers/chills, abd pain, nausea, urethral discharge. No lower back pain.  No h/o inguinal hernias.  Did have vasectomy 07/2015.   Happily married. No new sexual partners.   He does mention that occasionally L testicle does ascend into inguinal region.  Relevant past medical, surgical, family and social history reviewed and updated as indicated. Interim medical history since our last visit reviewed. Allergies and medications reviewed and updated. Current Outpatient Prescriptions on File Prior to Visit  Medication Sig  . cetirizine (ZYRTEC) 10 MG tablet Take 10 mg by mouth daily.    . fluticasone (FLONASE) 50 MCG/ACT nasal spray USE 2 SPRAYS IN EACH NOSTRIL DAILY  . hydrochlorothiazide (HYDRODIURIL) 25 MG tablet Take 1 tablet (25 mg total) by mouth daily.  . Multiple Vitamin (MULTIVITAMIN) tablet Take 1 tablet by mouth daily.     No current facility-administered medications on file prior to visit.    Review of Systems Per HPI unless specifically indicated in ROS section     Objective:    BP 148/98 mmHg  Pulse 94  Temp(Src) 98.3 F (36.8 C) (Oral)  Wt 268 lb 4 oz (121.677 kg)  SpO2 98%  Wt Readings from Last 3 Encounters:  09/12/15 268 lb 4 oz (121.677 kg)  04/12/15 269 lb (122.018 kg)  03/08/15 263 lb 8 oz (119.523 kg)    Physical Exam  Constitutional: He appears well-developed and well-nourished. No distress.  Abdominal: Soft. Normal appearance and bowel sounds are normal.  He exhibits no distension and no mass. There is no hepatosplenomegaly. There is no tenderness. There is no rigidity, no rebound, no guarding, no CVA tenderness and negative Murphy's sign. Hernia confirmed negative in the right inguinal area and confirmed negative in the left inguinal area.  Genitourinary: Penis normal. Right testis shows swelling (mild). Right testis shows no mass and no tenderness. Right testis is descended. Left testis shows swelling and tenderness. Left testis shows no mass. Left testis is descended. Circumcised.  Tight L testicle that is tender to palpation, possible cystocele No hernia appreciated today  Lymphadenopathy:       Right: No inguinal adenopathy present.       Left: No inguinal adenopathy present.  Nursing note and vitals reviewed.      Assessment & Plan:   Problem List Items Addressed This Visit    Testicular swelling, left - Primary    4d h/o painful swollen testicle. No LAD appreciated, no hernias appreciated. Concern for infectious vs non infectious epididymo-orchitis - but will check STAT testicular US to help r/o mass, hernia, torsion.  UA today. Will call with results. Abnormal UA - start levaquin 500mg  daily x 10 days for likely epididymo orchitis. Update if not improving within 2-3 days.       Relevant Orders   US Scrotum   POCT Urinalysis Dipstick (Automated) (Completed)   Urine culture   [place] Art/Ven Flow Abd Pelv Doppler  Follow up plan: Return if symptoms worsen or fail to improve.

## 2015-09-13 LAB — URINE CULTURE
COLONY COUNT: NO GROWTH
ORGANISM ID, BACTERIA: NO GROWTH

## 2016-02-23 ENCOUNTER — Other Ambulatory Visit: Payer: Self-pay | Admitting: Family Medicine

## 2016-02-23 DIAGNOSIS — Z119 Encounter for screening for infectious and parasitic diseases, unspecified: Secondary | ICD-10-CM

## 2016-02-23 DIAGNOSIS — I10 Essential (primary) hypertension: Secondary | ICD-10-CM

## 2016-03-02 ENCOUNTER — Other Ambulatory Visit (INDEPENDENT_AMBULATORY_CARE_PROVIDER_SITE_OTHER): Payer: 59

## 2016-03-02 DIAGNOSIS — I10 Essential (primary) hypertension: Secondary | ICD-10-CM

## 2016-03-02 DIAGNOSIS — Z119 Encounter for screening for infectious and parasitic diseases, unspecified: Secondary | ICD-10-CM

## 2016-03-02 LAB — COMPREHENSIVE METABOLIC PANEL
ALBUMIN: 4.2 g/dL (ref 3.5–5.2)
ALK PHOS: 57 U/L (ref 39–117)
ALT: 33 U/L (ref 0–53)
AST: 21 U/L (ref 0–37)
BILIRUBIN TOTAL: 0.4 mg/dL (ref 0.2–1.2)
BUN: 14 mg/dL (ref 6–23)
CALCIUM: 9.4 mg/dL (ref 8.4–10.5)
CO2: 31 mEq/L (ref 19–32)
Chloride: 102 mEq/L (ref 96–112)
Creatinine, Ser: 0.96 mg/dL (ref 0.40–1.50)
GFR: 91.61 mL/min (ref >60.00)
Glucose, Bld: 95 mg/dL (ref 70–99)
POTASSIUM: 3.7 meq/L (ref 3.5–5.1)
Sodium: 140 mEq/L (ref 135–145)
TOTAL PROTEIN: 6.8 g/dL (ref 6.0–8.3)

## 2016-03-02 LAB — LIPID PANEL
CHOLESTEROL: 252 mg/dL — AB (ref 0–200)
HDL: 38.5 mg/dL — AB (ref >39.00)
NonHDL: 213.09
Total CHOL/HDL Ratio: 7
Triglycerides: 334 mg/dL — ABNORMAL HIGH (ref 0.0–149.0)
VLDL: 66.8 mg/dL — AB (ref 0.0–40.0)

## 2016-03-02 LAB — LDL CHOLESTEROL, DIRECT: Direct LDL: 170 mg/dL

## 2016-03-03 LAB — HIV ANTIBODY (ROUTINE TESTING W REFLEX): HIV: NONREACTIVE

## 2016-03-09 ENCOUNTER — Ambulatory Visit (INDEPENDENT_AMBULATORY_CARE_PROVIDER_SITE_OTHER): Payer: 59 | Admitting: Family Medicine

## 2016-03-09 ENCOUNTER — Encounter: Payer: Self-pay | Admitting: Family Medicine

## 2016-03-09 VITALS — BP 172/90 | HR 101 | Temp 98.5°F | Wt 274.2 lb

## 2016-03-09 DIAGNOSIS — F17200 Nicotine dependence, unspecified, uncomplicated: Secondary | ICD-10-CM | POA: Insufficient documentation

## 2016-03-09 DIAGNOSIS — I10 Essential (primary) hypertension: Secondary | ICD-10-CM

## 2016-03-09 DIAGNOSIS — Z0000 Encounter for general adult medical examination without abnormal findings: Secondary | ICD-10-CM

## 2016-03-09 MED ORDER — HYDROCHLOROTHIAZIDE 25 MG PO TABS: 25.0000 mg | ORAL_TABLET | Freq: Every day | ORAL | 3 refills | Status: DC

## 2016-03-09 MED ORDER — LISINOPRIL 10 MG PO TABS: 10.0000 mg | ORAL_TABLET | Freq: Every day | ORAL | 3 refills | Status: DC

## 2016-03-09 NOTE — Progress Notes (Signed)
Pre visit review using our clinic review tool, if applicable. No additional management support is needed unless otherwise documented below in the visit note. 

## 2016-03-09 NOTE — Patient Instructions (Signed)
Keep taking HCTZ.  Add on lisinopril.  Recheck labs in about 2 weeks.   Need a lab visit.   Check your BP a few times out of clinic and update me.   If lightheaded, then update me.

## 2016-03-09 NOTE — Assessment & Plan Note (Signed)
Encouraged cessation with patch.  D/w pt.

## 2016-03-09 NOTE — Assessment & Plan Note (Signed)
Add on lisinopril, recheck labs soon, d/w pt.  See AVS.  He can check BP out of clinic.  Needs weight loss for BP and lipids.  We'll set f/u with I see his f/u labs and his BP readings.  Routine ace cautions d/w pt.  He agrees.  Continue hctz.

## 2016-03-09 NOTE — Progress Notes (Signed)
CPE- See plan.  Routine anticipatory guidance given to patient.  See health maintenance. Tetanus 2012  Flu encouraged.   PNA and shingles shot not due.  Prostate and colon cancer screening not due.  Diet and exercise. "lousy, crappy" diet.    He had been on an exercise bike but he didn't stick with it.  Encouraged.   Working to stop dipping and stop smoking. On nicotine replacement.  Living will d/w pt. Wife would be designated if patient were incapacitated.   Hypertension:    Using medication without problems or lightheadedness: yes Chest pain with exertion:no Edema:no Short of breath:no Average home BPs:not checked.   Other issues:back on 21mg  patch re: smoking cessation.   Still on HCTZ.  Compliant.  Labs d/w pt, esp lipids.    PMH and SH reviewed  Meds, vitals, and allergies reviewed.   ROS: Per HPI.  Unless specifically indicated otherwise in HPI, the patient denies:  General: fever. Eyes: acute vision changes ENT: sore throat Cardiovascular: chest pain Respiratory: SOB GI: vomiting GU: dysuria Musculoskeletal: acute back pain Derm: acute rash Neuro: acute motor dysfunction Psych: worsening mood Endocrine: polydipsia Heme: bleeding Allergy: hayfever  GEN: nad, alert and oriented HEENT: mucous membranes moist NECK: supple w/o LA CV: rrr. PULM: ctab, no inc wob ABD: soft, +bs EXT: no edema SKIN: no acute rash

## 2016-03-09 NOTE — Assessment & Plan Note (Signed)
Tetanus 2012  Flu encouraged.   PNA and shingles shot not due.  Prostate and colon cancer screening not due.  Diet and exercise. "lousy, crappy" diet.   D/w pt.    He had been on an exercise bike but he didn't stick with it.  Encouraged.   Working to stop dipping and stop smoking. On nicotine replacement.  Living will d/w pt. Wife would be designated if patient were incapacitated.

## 2016-03-23 ENCOUNTER — Telehealth: Payer: Self-pay | Admitting: Family Medicine

## 2016-03-23 ENCOUNTER — Other Ambulatory Visit (INDEPENDENT_AMBULATORY_CARE_PROVIDER_SITE_OTHER): Payer: 59

## 2016-03-23 ENCOUNTER — Other Ambulatory Visit: Payer: Self-pay | Admitting: Family Medicine

## 2016-03-23 DIAGNOSIS — I10 Essential (primary) hypertension: Secondary | ICD-10-CM

## 2016-03-23 LAB — BASIC METABOLIC PANEL
BUN: 16 mg/dL (ref 6–23)
CALCIUM: 9.1 mg/dL (ref 8.4–10.5)
CO2: 27 mEq/L (ref 19–32)
CREATININE: 0.91 mg/dL (ref 0.40–1.50)
Chloride: 105 mEq/L (ref 96–112)
GFR: 97.42 mL/min (ref >60.00)
GLUCOSE: 97 mg/dL (ref 70–99)
Potassium: 3.6 mEq/L (ref 3.5–5.1)
SODIUM: 139 meq/L (ref 135–145)

## 2016-03-23 MED ORDER — LISINOPRIL 10 MG PO TABS: 20.0000 mg | ORAL_TABLET | Freq: Every day | ORAL | Status: DC

## 2016-03-23 NOTE — Telephone Encounter (Signed)
Original notes from patient reviewed, then shredded.  Info entered in this note. See labs notes if needed.   Labs are fine. Since BP still up on home checks, usually 130-150s/80-90s, then I would inc lisinopril to 20mg  a day (2x10mg  tabs). If tolerated and BP generally <140/<90, then continue 20mg  a day.  Can take both 10mg  tabs at the same time.  If BP controlled and he needs new rx for 20mg  tabs sent in, then let me know.  If BP stays up after 2 weeks of 20mg  a day, then inc to 30mg  and update me as needed.   Would still continue HCTZ as is.

## 2016-03-23 NOTE — Telephone Encounter (Signed)
Placed in Dr. Buckner [place].

## 2016-03-23 NOTE — Telephone Encounter (Signed)
Pt dropped off BP readings for Dr. Josefine Class review.  Placed in prescription tower.  Best number to call pt if needed is [phone]

## 2016-04-10 ENCOUNTER — Ambulatory Visit (INDEPENDENT_AMBULATORY_CARE_PROVIDER_SITE_OTHER): Payer: 59 | Admitting: Family Medicine

## 2016-04-10 ENCOUNTER — Encounter: Payer: Self-pay | Admitting: Family Medicine

## 2016-04-10 VITALS — BP 132/96 | HR 80 | Temp 99.0°F | Wt 265.2 lb

## 2016-04-10 DIAGNOSIS — I10 Essential (primary) hypertension: Secondary | ICD-10-CM

## 2016-04-10 DIAGNOSIS — M25562 Pain in left knee: Secondary | ICD-10-CM

## 2016-04-10 MED ORDER — LISINOPRIL 40 MG PO TABS: 40.0000 mg | ORAL_TABLET | Freq: Every day | ORAL | 3 refills | Status: DC

## 2016-04-10 NOTE — Progress Notes (Signed)
Pre visit review using our clinic review tool, if applicable. No additional management support is needed unless otherwise documented below in the visit note. 

## 2016-04-10 NOTE — Patient Instructions (Addendum)
Go to the lab on the way out.  We'll contact you with your lab report. Go up to 40mg  a day.   Update me as needed.  Ice your knee.  We can set you up with Dr. Lorelei Pont if needed.   Take care.  Glad to see you.

## 2016-04-10 NOTE — Progress Notes (Signed)
Recently on 30mg  lisinopril.  Usually his BP is ~140-150s/90-100s.  Resting in the AM for those BPs recorded at home.   No CP SOB BLE edema.  No cough.   No ADE on med.    Knee pain, left.  Started Monday.  Was walking up a slope and heard a pop.  "It felt weird."  No trauma.  Was sore the next day.  Then was feeling better until coming back from a walk yesterday.  Was going up steps and the knee "locked up".  It popped last night but not since.  Knee gets stiff with prolonged sitting.  No bruising.  Knee is puffy.  No redness.  Can still bear weight.  Pain with getting up from squatting down.  He hasn't had locking since last night.  He was clearly better earlier today than last night, and better now than this AM.   Meds, vitals, and allergies reviewed.   ROS: Per HPI unless specifically indicated in ROS section   GEN: nad, alert and oriented HEENT: mucous membranes moist NECK: supple w/o LA CV: rrr. PULM: ctab, no inc wob ABD: soft, +bs Left knee is slightly puffy, but no bruising and no erythema. Not tender to palpation on the joint line, patella is nontender to palpation. Anterior cruciate ligament MCL and LCL feel solid on testing. No meniscal click that I can appreciate. Normal range of motion. Patient has no locking with knee range of motion. No significant crepitus that I can appreciate on range of motion. Able to bear weight.

## 2016-04-11 LAB — BASIC METABOLIC PANEL
BUN: 12 mg/dL (ref 7–25)
CALCIUM: 8.8 mg/dL (ref 8.6–10.3)
CO2: 29 mmol/L (ref 20–31)
Chloride: 103 mmol/L (ref 98–110)
Creat: 1 mg/dL (ref 0.60–1.35)
GLUCOSE: 94 mg/dL (ref 65–99)
POTASSIUM: 3.8 mmol/L (ref 3.5–5.3)
SODIUM: 141 mmol/L (ref 135–146)

## 2016-04-13 NOTE — Assessment & Plan Note (Signed)
This morning he was clearly better than yesterday, and now he is even better than this morning. He could have had irritation of the meniscus, but if so he would likely have minimal damage given the quick recovery and lack of significant symptoms at this point. Discussed with patient. He agrees. Imaging at this point would likely not be useful. He'll ice the knee. We can set him up with Dr. Lorelei Pont if needed.

## 2016-04-13 NOTE — Assessment & Plan Note (Signed)
Increase lisinopril to 40 mg. Check labs today, see notes on labs. Will update me about his blood pressure, if not controlled. Okay for outpatient follow-up.

## 2016-04-14 ENCOUNTER — Encounter: Payer: Self-pay | Admitting: *Deleted

## 2016-09-04 ENCOUNTER — Telehealth: Payer: Self-pay

## 2016-09-04 NOTE — Telephone Encounter (Signed)
Pt going out of country on 09/09/16 and request travel sickness med if pt were to get sick from the food or water in [place]. Pt said has been given Cipro before.[facility]. Pt last seen 04/10/16.

## 2016-09-06 MED ORDER — CIPROFLOXACIN HCL 500 MG PO TABS: 500.0000 mg | ORAL_TABLET | Freq: Two times a day (BID) | ORAL | 0 refills | Status: DC

## 2016-09-06 NOTE — Telephone Encounter (Signed)
Okay to take cipro.  rx sent.  If much better after 1-2 doses, doesn't have to take full 3 day course.  Thanks.

## 2016-09-07 NOTE — Telephone Encounter (Signed)
Patient notified as instructed by telephone and verbalized understanding. 

## 2017-04-05 ENCOUNTER — Other Ambulatory Visit: Payer: Self-pay | Admitting: Family Medicine

## 2017-04-09 ENCOUNTER — Ambulatory Visit (INDEPENDENT_AMBULATORY_CARE_PROVIDER_SITE_OTHER): Payer: 59 | Admitting: Family Medicine

## 2017-04-09 ENCOUNTER — Encounter: Payer: Self-pay | Admitting: Family Medicine

## 2017-04-09 VITALS — BP 120/76 | HR 81 | Temp 98.3°F | Wt 259.2 lb

## 2017-04-09 DIAGNOSIS — Z7189 Other specified counseling: Secondary | ICD-10-CM

## 2017-04-09 DIAGNOSIS — I10 Essential (primary) hypertension: Secondary | ICD-10-CM

## 2017-04-09 DIAGNOSIS — Z0000 Encounter for general adult medical examination without abnormal findings: Secondary | ICD-10-CM

## 2017-04-09 DIAGNOSIS — L989 Disorder of the skin and subcutaneous tissue, unspecified: Secondary | ICD-10-CM

## 2017-04-09 LAB — COMPREHENSIVE METABOLIC PANEL
ALT: 36 U/L (ref 0–53)
AST: 27 U/L (ref 0–37)
Albumin: 4.4 g/dL (ref 3.5–5.2)
Alkaline Phosphatase: 56 U/L (ref 39–117)
BUN: 13 mg/dL (ref 6–23)
CO2: 31 meq/L (ref 19–32)
CREATININE: 1 mg/dL (ref 0.40–1.50)
Calcium: 9.7 mg/dL (ref 8.4–10.5)
Chloride: 102 mEq/L (ref 96–112)
GFR: 86.93 mL/min (ref >60.00)
GLUCOSE: 105 mg/dL — AB (ref 70–99)
POTASSIUM: 4.7 meq/L (ref 3.5–5.1)
SODIUM: 138 meq/L (ref 135–145)
Total Bilirubin: 0.4 mg/dL (ref 0.2–1.2)
Total Protein: 7 g/dL (ref 6.0–8.3)

## 2017-04-09 LAB — LIPID PANEL
CHOL/HDL RATIO: 6
Cholesterol: 247 mg/dL — ABNORMAL HIGH (ref 0–200)
HDL: 39.1 mg/dL (ref >39.00)
LDL CALC: 179 mg/dL — AB (ref 0–99)
NonHDL: 208.36
Triglycerides: 147 mg/dL (ref 0.0–149.0)
VLDL: 29.4 mg/dL (ref 0.0–40.0)

## 2017-04-09 MED ORDER — LISINOPRIL 40 MG PO TABS: 40.0000 mg | ORAL_TABLET | Freq: Every day | ORAL | 12 refills | Status: DC

## 2017-04-09 MED ORDER — HYDROCHLOROTHIAZIDE 25 MG PO TABS: 25.0000 mg | ORAL_TABLET | Freq: Every day | ORAL | 12 refills | Status: DC

## 2017-04-09 NOTE — Patient Instructions (Signed)
Go to the lab on the way out.  We'll contact you with your lab report. Don't change your meds.   If you are lightheaded, then cut out the HCTZ- especially on hot days.   Take care.  Glad to see you.  Ask up front for a procedure appointment for me to take off the spot on the back of your neck.

## 2017-04-09 NOTE — Progress Notes (Signed)
CPE- See plan.  Routine anticipatory guidance given to patient.  See health maintenance.  The possibility exists that previously documented standard health maintenance information may have been brought forward from a previous encounter into this note.  If needed, that same information has been updated to reflect the current situation based on today's encounter.    Tetanus 2012  Flu encouraged.  Usually done at work.   PNA and shingles shot not due.  Prostate and colon cancer screening not due.  Diet and exercise d/w pt.  He is working on both.   He stopped dipping and tried to stop smoking.  Living will d/w pt. Wife would be designated if patient were incapacitated.   Hypertension:    Using medication without problems or lightheadedness: yes Chest pain with exertion:no Edema:no Short of breath:no  Smoking cessation d/w pt.  He "fell off the wagon" and restarted.  Encouraged cessation.  He snores more when smoking.  Wife isn't noting him to have apneas.  D/w pt about weight mgmt. He is trying to lose weight.  He is packing his lunch and eating salads.   PMH and SH reviewed  Meds, vitals, and allergies reviewed.   ROS: Per HPI.  Unless specifically indicated otherwise in HPI, the patient denies:  General: fever. Eyes: acute vision changes ENT: sore throat Cardiovascular: chest pain Respiratory: SOB GI: vomiting GU: dysuria Musculoskeletal: acute back pain Derm: acute rash Neuro: acute motor dysfunction Psych: worsening mood Endocrine: polydipsia Heme: bleeding Allergy: hayfever  GEN: nad, alert and oriented HEENT: mucous membranes moist NECK: supple w/o LA CV: rrr. PULM: ctab, no inc wob ABD: soft, +bs EXT: no edema SKIN: no acute rash but 5 mm papule noted on the right posterior neck. No ulceration.

## 2017-04-12 DIAGNOSIS — L989 Disorder of the skin and subcutaneous tissue, unspecified: Secondary | ICD-10-CM | POA: Insufficient documentation

## 2017-04-12 NOTE — Assessment & Plan Note (Signed)
Does not appear to be a basal cell or squamous cell cancer. No ulceration. No telangiectasia. Does not look typical for melanoma. It looks like it can get irritated. Discussed with patient about removal. He can return for shave biopsy.

## 2017-04-12 NOTE — Assessment & Plan Note (Addendum)
Tetanus 2012  Flu encouraged.  Usually done at work.   PNA and shingles shot not due.  Prostate and colon cancer screening not due.  Diet and exercise d/w pt.  He is working on both.   He stopped dipping and tried to stop smoking.  Living will d/w pt. Wife would be designated if patient were incapacitated.

## 2017-04-12 NOTE — Assessment & Plan Note (Signed)
Continue work on diet and exercise. Continue current medications. Continue work on smoking cessation. Discussed with patient. He agrees. See notes on labs.

## 2017-04-19 ENCOUNTER — Encounter: Payer: Self-pay | Admitting: Family Medicine

## 2017-04-19 ENCOUNTER — Ambulatory Visit (INDEPENDENT_AMBULATORY_CARE_PROVIDER_SITE_OTHER): Payer: 59 | Admitting: Family Medicine

## 2017-04-19 VITALS — BP 136/92 | HR 95 | Temp 98.3°F | Wt 259.0 lb

## 2017-04-19 DIAGNOSIS — L989 Disorder of the skin and subcutaneous tissue, unspecified: Secondary | ICD-10-CM

## 2017-04-19 DIAGNOSIS — L720 Epidermal cyst: Secondary | ICD-10-CM | POA: Diagnosis not present

## 2017-04-19 NOTE — Patient Instructions (Signed)
Keep the area clean and covered.  Update [place] as needed.  We'll contact you with your lab report.

## 2017-04-20 ENCOUNTER — Encounter: Payer: Self-pay | Admitting: Family Medicine

## 2017-04-20 NOTE — Assessment & Plan Note (Signed)
Routine postprocedure instructions d/w pt- keep area clean and bandaged, follow up if concerns/spreading erythema/pain. Sample sent for pathology. In the midst of the biopsy, it appeared that the lesion was cystic. The entire cyst wall was removed. Discussed with patient. No complications.

## 2017-04-20 NOTE — Progress Notes (Signed)
Shave biopsy  Meds, vitals, and allergies reviewed.   Indication: irritated lesion  Location: R upper back near the neck  Size: 20mm  Informed consent obtained.  Pt aware of risks not limited to but including infection, bleeding, damage to near by organs.  Prep: etoh/betadine  Anesthesia: 1%lidocaine with epi, good effect  Shave made with dermablade  Minimal oozing, controlled with silver nitrate  Tolerated well

## 2018-04-12 ENCOUNTER — Other Ambulatory Visit: Payer: Self-pay | Admitting: Family Medicine

## 2018-04-12 ENCOUNTER — Other Ambulatory Visit (INDEPENDENT_AMBULATORY_CARE_PROVIDER_SITE_OTHER): Payer: 59

## 2018-04-12 DIAGNOSIS — I10 Essential (primary) hypertension: Secondary | ICD-10-CM

## 2018-04-12 LAB — COMPREHENSIVE METABOLIC PANEL
ALK PHOS: 54 U/L (ref 39–117)
ALT: 31 U/L (ref 0–53)
AST: 29 U/L (ref 0–37)
Albumin: 4.1 g/dL (ref 3.5–5.2)
BILIRUBIN TOTAL: 0.4 mg/dL (ref 0.2–1.2)
BUN: 14 mg/dL (ref 6–23)
CO2: 29 mEq/L (ref 19–32)
CREATININE: 1.05 mg/dL (ref 0.40–1.50)
Calcium: 9 mg/dL (ref 8.4–10.5)
Chloride: 103 mEq/L (ref 96–112)
GFR: 81.78 mL/min (ref >60.00)
GLUCOSE: 104 mg/dL — AB (ref 70–99)
Potassium: 3.6 mEq/L (ref 3.5–5.1)
SODIUM: 140 meq/L (ref 135–145)
TOTAL PROTEIN: 6.6 g/dL (ref 6.0–8.3)

## 2018-04-12 LAB — LIPID PANEL
Cholesterol: 234 mg/dL — ABNORMAL HIGH (ref 0–200)
HDL: 38.5 mg/dL — ABNORMAL LOW (ref >39.00)
LDL CALC: 156 mg/dL — AB (ref 0–99)
NonHDL: 195.28
TRIGLYCERIDES: 194 mg/dL — AB (ref 0.0–149.0)
Total CHOL/HDL Ratio: 6
VLDL: 38.8 mg/dL (ref 0.0–40.0)

## 2018-04-15 ENCOUNTER — Encounter: Payer: 59 | Admitting: Family Medicine

## 2018-04-15 ENCOUNTER — Other Ambulatory Visit: Payer: Self-pay | Admitting: Family Medicine

## 2018-04-21 ENCOUNTER — Encounter: Payer: Self-pay | Admitting: Family Medicine

## 2018-04-21 ENCOUNTER — Other Ambulatory Visit: Payer: Self-pay | Admitting: *Deleted

## 2018-04-21 ENCOUNTER — Ambulatory Visit (INDEPENDENT_AMBULATORY_CARE_PROVIDER_SITE_OTHER): Payer: 59 | Admitting: Family Medicine

## 2018-04-21 VITALS — BP 152/90 | HR 83 | Temp 98.5°F | Ht 73.5 in | Wt 265.8 lb

## 2018-04-21 DIAGNOSIS — Z0000 Encounter for general adult medical examination without abnormal findings: Secondary | ICD-10-CM | POA: Diagnosis not present

## 2018-04-21 DIAGNOSIS — Z23 Encounter for immunization: Secondary | ICD-10-CM | POA: Diagnosis not present

## 2018-04-21 DIAGNOSIS — E669 Obesity, unspecified: Secondary | ICD-10-CM

## 2018-04-21 DIAGNOSIS — R0683 Snoring: Secondary | ICD-10-CM

## 2018-04-21 DIAGNOSIS — I10 Essential (primary) hypertension: Secondary | ICD-10-CM

## 2018-04-21 DIAGNOSIS — Z9229 Personal history of other drug therapy: Secondary | ICD-10-CM

## 2018-04-21 DIAGNOSIS — Z7184 Encounter for health counseling related to travel: Secondary | ICD-10-CM

## 2018-04-21 DIAGNOSIS — G4733 Obstructive sleep apnea (adult) (pediatric): Secondary | ICD-10-CM | POA: Insufficient documentation

## 2018-04-21 DIAGNOSIS — Z7189 Other specified counseling: Secondary | ICD-10-CM

## 2018-04-21 MED ORDER — CIPROFLOXACIN HCL 500 MG PO TABS: ORAL_TABLET | ORAL | 0 refills | Status: DC

## 2018-04-21 MED ORDER — ALPRAZOLAM 0.25 MG PO TABS: 0.2500 mg | ORAL_TABLET | Freq: Two times a day (BID) | ORAL | 0 refills | Status: DC | PRN

## 2018-04-21 MED ORDER — HYDROCHLOROTHIAZIDE 25 MG PO TABS: 25.0000 mg | ORAL_TABLET | Freq: Every day | ORAL | 12 refills | Status: DC

## 2018-04-21 MED ORDER — LISINOPRIL 40 MG PO TABS: 40.0000 mg | ORAL_TABLET | Freq: Every day | ORAL | 12 refills | Status: DC

## 2018-04-21 MED ORDER — TYPHOID VACCINE PO CPDR: 1.0000 | DELAYED_RELEASE_CAPSULE | ORAL | 0 refills | Status: DC

## 2018-04-21 NOTE — Assessment & Plan Note (Signed)
Discussed with patient about diet and exercise.

## 2018-04-21 NOTE — Progress Notes (Signed)
CPE- See plan.  Routine anticipatory guidance given to patient.  See health maintenance.  The possibility exists that previously documented standard health maintenance information may have been brought forward from a previous encounter into this note.  If needed, that same information has been updated to reflect the current situation based on today's encounter.    Tetanus 2012  Flu today.   PNA and shingles shot not due.  Prostate and colon cancer screening not due.  Diet and exercise d/w pt. He is working on both.   Tobacco use d/w pt.  Encouraged cessation.   Living will d/w pt. Wife would be designated if patient were incapacitated.   D/w pt about snoring.  Snoring more since his weight was up.  D/w pt about weight loss and then if sx continue he'll update me.  No AM HA.  No fatigue.  Okay for outpatient follow-up.  Hypertension:    Using medication without problems or lightheadedness:  yes Chest pain with exertion:no Edema:no Short of breath:no Labs d/w pt.    Going to [place] for 2 weeks next month.  Anniversary trip with his wife.   D/w pt about vaccines and cautions.  He has anxiety related to flying/travel.  CDC travel data discussed with patient.  PMH and SH reviewed  Meds, vitals, and allergies reviewed.   ROS: Per HPI.  Unless specifically indicated otherwise in HPI, the patient denies:  General: fever. Eyes: acute vision changes ENT: sore throat Cardiovascular: chest pain Respiratory: SOB GI: vomiting GU: dysuria Musculoskeletal: acute back pain Derm: acute rash Neuro: acute motor dysfunction Psych: worsening mood Endocrine: polydipsia Heme: bleeding Allergy: hayfever  GEN: nad, alert and oriented HEENT: mucous membranes moist NECK: supple w/o LA CV: rrr. PULM: ctab, no inc wob ABD: soft, +bs EXT: no edema SKIN: no acute rash

## 2018-04-21 NOTE — Telephone Encounter (Signed)
Spoke to [employer], pharmacist, who states she is needing specific directions for pts cipro. She is not able to fill as written

## 2018-04-21 NOTE — Assessment & Plan Note (Signed)
Typhoid vaccine discussed with patient.  Prescription sent.  Start hep A/hep B vaccine series.  Flu shot done today.  Check MMR titer.  Routine cautions given.  He agrees.  Can use Xanax if needed with sedation caution during plane trip.  Can use Cipro if needed for traveler's diarrhea.

## 2018-04-21 NOTE — Assessment & Plan Note (Signed)
Tetanus 2012  Flu today.   PNA and shingles shot not due.  Prostate and colon cancer screening not due.  Diet and exercise d/w pt. He is working on both.   Tobacco use d/w pt.  Encouraged cessation.   Living will d/w pt. Wife would be designated if patient were incapacitated.

## 2018-04-21 NOTE — Assessment & Plan Note (Signed)
No change in meds.  Needs work on diet and exercise.  Labs discussed with patient.  He will update me if blood pressure is not controlled on home check.

## 2018-04-21 NOTE — Assessment & Plan Note (Signed)
Wife designated if patient were incapacitated.  

## 2018-04-21 NOTE — Telephone Encounter (Signed)
I mistakenly sent with incomplete sig.  I thank pharmacy for the question.  Resent.  Thanks.

## 2018-04-21 NOTE — Patient Instructions (Addendum)
Go to the lab on the way out.  We'll contact you with your lab report. Start the typhoid vaccine.  1 pill every other day for 4 doses.   Xanax if needed for anxiety, sedation caution.  2nd HAV/HBV vaccine in 1 month.  3rd done in 6 months.    If the snoring continues in spite of weight loss, then let us know.    Check your BP at home.  <140/<90.   Update me if persistently up.   Take care.  Glad to see you.

## 2018-04-21 NOTE — Assessment & Plan Note (Signed)
He can update me if not improved with weight loss.  Discussed.  He agrees.

## 2018-04-22 LAB — MEASLES/MUMPS/RUBELLA IMMUNITY
MUMPS IGG: 127 [AU]/ml
Rubella: 1.96 index
Rubeola IgG: <25 AU/mL — ABNORMAL LOW

## 2018-05-04 ENCOUNTER — Ambulatory Visit (INDEPENDENT_AMBULATORY_CARE_PROVIDER_SITE_OTHER): Payer: 59 | Admitting: *Deleted

## 2018-05-04 DIAGNOSIS — Z23 Encounter for immunization: Secondary | ICD-10-CM

## 2018-05-12 ENCOUNTER — Encounter: Payer: Self-pay | Admitting: Family Medicine

## 2018-09-12 ENCOUNTER — Ambulatory Visit: Payer: 59 | Admitting: Family Medicine

## 2018-09-12 ENCOUNTER — Encounter: Payer: Self-pay | Admitting: Family Medicine

## 2018-09-12 VITALS — BP 132/90 | HR 78 | Temp 97.9°F | Ht 73.5 in

## 2018-09-12 DIAGNOSIS — Z23 Encounter for immunization: Secondary | ICD-10-CM | POA: Diagnosis not present

## 2018-09-12 DIAGNOSIS — I10 Essential (primary) hypertension: Secondary | ICD-10-CM | POA: Diagnosis not present

## 2018-09-12 DIAGNOSIS — R0683 Snoring: Secondary | ICD-10-CM | POA: Diagnosis not present

## 2018-09-12 MED ORDER — AZITHROMYCIN 500 MG PO TABS: 500.0000 mg | ORAL_TABLET | Freq: Every day | ORAL | 0 refills | Status: DC

## 2018-09-12 NOTE — Progress Notes (Signed)
He had a good trip to [place] back in the fall.  He is off cigs for the last month.  D/w pt.  Encouraged cessation.    He is travelling to [place], d/w pt about zmax use in case of traveller's diarrhea.    Hypertension:    Using medication without problems or lightheadedness: yes Chest pain with exertion:no Edema:no Short of breath:no  He is still snoring.  He is working on diet.  Our scales are out of order so I don't have a weight for today.    Second dose of hep A/hep B vaccine today.  Meds, vitals, and allergies reviewed.   ROS: Per HPI unless specifically indicated in ROS section   GEN: nad, alert and oriented HEENT: mucous membranes moist NECK: supple w/o LA CV: rrr PULM: ctab, no inc wob ABD: soft, +bs EXT: no edema SKIN: no acute rash

## 2018-09-12 NOTE — Patient Instructions (Addendum)
We make arrangements for referrals, extra imaging, and other appointments based on the urgency of the situation. Referrals are handled based on the clinical situation, not in the order that they are placed. If you do not see one of our referral coordinators on the way out of the clinic today, then you should expect a call in the next 1 to 2 weeks. We work diligently to process all referrals as quickly as possible.    Take care.  Glad to see you.  Keep working on diet and exercise.    2nd dose of HAV/HBV today.  3rd dose of HAV/HBV after 12/11/2018.

## 2018-09-14 DIAGNOSIS — Z23 Encounter for immunization: Secondary | ICD-10-CM | POA: Insufficient documentation

## 2018-09-14 NOTE — Assessment & Plan Note (Signed)
See discussion regarding snoring and sleep apnea evaluation.

## 2018-09-14 NOTE — Assessment & Plan Note (Signed)
2nd dose of HAV/HBV today.  3rd dose of HAV/HBV after 12/11/2018.

## 2018-09-14 NOTE — Assessment & Plan Note (Signed)
Refer for sleep apnea testing.  Discussed with patient about diet and exercise and weight.  Rationale for evaluation discussed.  He agrees.  Update me as needed.  No change in meds at this point otherwise.

## 2018-10-03 ENCOUNTER — Institutional Professional Consult (permissible substitution): Payer: 59 | Admitting: Pulmonary Disease

## 2018-10-07 ENCOUNTER — Institutional Professional Consult (permissible substitution): Payer: 59 | Admitting: Pulmonary Disease

## 2018-10-17 ENCOUNTER — Encounter: Payer: Self-pay | Admitting: Internal Medicine

## 2018-10-17 ENCOUNTER — Ambulatory Visit: Payer: 59 | Admitting: Internal Medicine

## 2018-10-17 VITALS — BP 138/64 | HR 91 | Ht 74.75 in | Wt 258.2 lb

## 2018-10-17 DIAGNOSIS — R0683 Snoring: Secondary | ICD-10-CM | POA: Diagnosis not present

## 2018-10-17 DIAGNOSIS — F17200 Nicotine dependence, unspecified, uncomplicated: Secondary | ICD-10-CM | POA: Diagnosis not present

## 2018-10-17 DIAGNOSIS — G4733 Obstructive sleep apnea (adult) (pediatric): Secondary | ICD-10-CM

## 2018-10-17 DIAGNOSIS — J301 Allergic rhinitis due to pollen: Secondary | ICD-10-CM | POA: Diagnosis not present

## 2018-10-17 NOTE — Patient Instructions (Signed)
Order- please schedule unattended home sleep test   Dx OSA   Please call me about 2 weeks after your sleep study, for results and recommendations. If appropriate, we may be able to start treatment before we see you again.

## 2018-10-17 NOTE — Assessment & Plan Note (Signed)
He is managing now with antihistamine and Flonase.  He recognizes that nasal stuffiness may contribute to mouth breathing and snoring.  This problem will be a consideration as we move forward with treatment options.

## 2018-10-17 NOTE — Progress Notes (Signed)
10/16/2018-43 year old male  smoker for sleep evaluation. referred by Dr. Damita Dunnings (PCP) for snoring; no issues w/ breathing, had a sleep test a long time ago Medical problem list includes HBP, seasonal allergic rhinitis, obesity, referred by Dr. Damita Dunnings (PCP) for snoring; no issues w/ breathing, had a sleep test a long time ago Epworth score 5 Wife tells him of loud snoring but has not mentioned witnessed apneas.  A sleep study at [facility] several years ago was positive in low range.  He does not know the score but his doctor told him it was "borderline" at that time. Not treated. Main concern now is related to his hypertension.  He denies ENT surgery or other cardiopulmonary problem.  Family history positive for several with loud snoring but not diagnosed OSA. Seasonal allergic rhinitis starting out now, contributes to snoring.  He is using Flonase.  Prior to Admission medications   Medication Sig Start Date End Date Taking? Authorizing Provider  fexofenadine (ALLEGRA) 60 MG tablet Take 60 mg by mouth daily. OTC medication   Yes [provider]  fluticasone (FLONASE) 50 MCG/ACT nasal spray USE 2 SPRAYS IN EACH NOSTRIL DAILY 03/06/14  Yes Tonia Ghent, MD  hydrochlorothiazide (HYDRODIURIL) 25 MG tablet Take 1 tablet (25 mg total) by mouth daily. 04/21/18  Yes Tonia Ghent, MD  lisinopril (PRINIVIL,ZESTRIL) 40 MG tablet Take 1 tablet (40 mg total) by mouth daily. 04/21/18  Yes Tonia Ghent, MD  Multiple Vitamin (MULTIVITAMIN) tablet Take 1 tablet by mouth daily.      [provider]   Past Medical History:  Diagnosis Date  . Back pain    hx or ruptured disk, went through PT around 2005  . GERD (gastroesophageal reflux disease)   . Hypertension   . OSA (obstructive sleep apnea)    hx of, not on CPAP  . Rhinitis    better with nasocort use  . Tobacco abuse    dip tobacco   Past Surgical History:  Procedure Laterality Date  . VASECTOMY  07/2015   Family History   Problem Relation Age of Onset  . Hypertension Father   . Stroke Father   . Hypertension Sister   . Kidney failure Paternal Grandfather   . Colon cancer Neg Hx   . Prostate cancer Neg Hx    Social History   Socioeconomic History  . Marital status: Married    Spouse name: Not on file  . Number of children: 0  . Years of education: Not on file  . Highest education level: Not on file  Occupational History  . Occupation: [employer]    Comment: since [employer]  . Financial resource strain: Not on file  . Food insecurity:    Worry: Not on file    Inability: Not on file  . Transportation needs:    Medical: Not on file    Non-medical: Not on file  Tobacco Use  . Smoking status: Current Some Day Smoker    Packs/day: 1.00    Types: Cigarettes  . Smokeless tobacco: Current User  . Tobacco comment: as of 10/17/2018, states he smokes every now and then  Substance and Sexual Activity  . Alcohol use: Yes    Alcohol/week: 0.0 standard drinks    Comment: Occasional beer  . Drug use: No  . Sexual activity: Not on file  Lifestyle  . Physical activity:    Days per week: Not on file    Minutes per session: Not on file  .  Stress: Not on file  Relationships  . Social connections:    Talks on phone: Not on file    Gets together: Not on file    Attends religious service: Not on file    Active member of club or organization: Not on file    Attends meetings of clubs or organizations: Not on file    Relationship status: Not on file  . Intimate partner violence:    Fear of current or ex partner: Not on file    Emotionally abused: Not on file    Physically abused: Not on file    Forced sexual activity: Not on file  Other Topics Concern  . Not on file  Social History Narrative   From [facility].   Walking some for exercise   Working for [employer] in [place].     Married 1999, no kids   ROS-see HPI  + = positive Constitutional:    weight loss, night  sweats, fevers, chills, fatigue, lassitude. HEENT:    headaches, difficulty swallowing, tooth/dental problems, sore throat,       +sneezing, itching, ear ache, nasal congestion, post nasal drip, snoring CV:    chest pain, orthopnea, PND, swelling in lower extremities, anasarca,                                  dizziness, palpitations Resp:   shortness of breath with exertion or at rest.                productive cough,   non-productive cough, coughing up of blood.              change in color of mucus.  wheezing.   Skin:    rash or lesions. GI:  No-   heartburn, indigestion, abdominal pain, nausea, vomiting, diarrhea,                 change in bowel habits, loss of appetite GU: dysuria, change in color of urine, no urgency or frequency.   flank pain. MS:   joint pain, stiffness, decreased range of motion, back pain. Neuro-     nothing unusual Psych:  change in mood or affect.  depression or anxiety.   memory loss.  OBJ- Physical Exam General- Alert, Oriented, Affect-appropriate, Distress- none acute Skin- rash-none, lesions- none, excoriation- none Lymphadenopathy- none Head- atraumatic            Eyes- Gross vision intact, PERRLA, conjunctivae and secretions clear            Ears- Hearing, canals-normal            Nose- Clear, no-Septal dev, mucus, polyps, erosion, perforation             Throat- Mallampati II- III , mucosa clear , drainage- none, tonsils- atrophic Neck- flexible , trachea midline, no stridor , thyroid nl, carotid no bruit Chest - symmetrical excursion , unlabored           Heart/CV- RRR/ occasional extra beat , no murmur , no gallop  , no rub, nl s1 s2                           - JVD- none , edema- none, stasis changes- none, varices- none           Lung- clear to P&A, wheeze- none, cough- none , dullness-none, rub- none  Chest wall-  Abd-  Br/ Gen/ Rectal- Not done, not indicated Extrem- cyanosis- none, clubbing, none, atrophy- none, strength- nl Neuro-  grossly intact to observation

## 2018-10-17 NOTE — Assessment & Plan Note (Signed)
Probable obstructive sleep apnea, severity uncertain.  We discussed the physiology, medical concerns and available treatments in some detail.  He indicates some reluctance to use CPAP, mentioning the possibility that he would travel without it.  We did discuss oral appliances and available surgeries.  He agrees to schedule home sleep test with realization that CPAP would probably be the initial treatment of choice if appropriate.

## 2018-10-17 NOTE — Assessment & Plan Note (Signed)
Cessation was strongly advised

## 2018-12-07 ENCOUNTER — Other Ambulatory Visit: Payer: Self-pay

## 2018-12-07 ENCOUNTER — Ambulatory Visit: Payer: 59

## 2018-12-07 DIAGNOSIS — G4733 Obstructive sleep apnea (adult) (pediatric): Secondary | ICD-10-CM

## 2018-12-09 DIAGNOSIS — G4733 Obstructive sleep apnea (adult) (pediatric): Secondary | ICD-10-CM | POA: Diagnosis not present

## 2018-12-13 ENCOUNTER — Ambulatory Visit (INDEPENDENT_AMBULATORY_CARE_PROVIDER_SITE_OTHER): Payer: 59

## 2018-12-13 DIAGNOSIS — Z23 Encounter for immunization: Secondary | ICD-10-CM

## 2018-12-13 NOTE — Progress Notes (Signed)
Per orders of Dr. Damita Dunnings, injection of  3rd Hep A/Hep B combo-Twinrix given by Kris Mouton. Patient tolerated injection well.

## 2018-12-20 ENCOUNTER — Telehealth: Payer: Self-pay | Admitting: Internal Medicine

## 2018-12-20 NOTE — Telephone Encounter (Signed)
Pt called requesting results of HST that was performed. Dr. Annamaria Boots, please advise on this for pt. Thanks!

## 2018-12-20 NOTE — Telephone Encounter (Signed)
His sleep study showed moderately severe obstructive sleep apnea, averaging 21 apnea episodes/ hour, with drops in blood oxygen level. First choice of treatment for scores in this range is CPAP. He had been concerned about trying to travel with CPAP, so an alternative would be to get fitted for an oral appliance (mouth piece) to treat sleep apnea.  If he wants to try CPAP- order DME, new CPAP auto 5-20, mask of choice, humidifier, supplies, Air/view Followed by a return ov here in 3 months  If he wants to see about a mouthpiece- order referral to Dr Oneal Grout, Orthodontist, for an oral appliance to treat OSA F/U here prn

## 2018-12-20 NOTE — Telephone Encounter (Signed)
Called and spoke with pt letting him know the results of the HST and stated to him the two treatment options. Pt stated he would give it some thought on what he wants to do in regards to if he wants to go the CPAP route or if he wants to go the oral appliance route and then he would call us back after he gave it some thought. Nothing further needed at this time.

## 2018-12-27 NOTE — Telephone Encounter (Signed)
Ok to make referral to Orthodontist Dr Oneal Grout to consider oral appliance for treatment of moderate obstructive sleep apnea, unles patient wishes to go somewhere else.  Ok to cancel f/u with [place]. Happy to see him again in the future if we can help.

## 2018-12-28 ENCOUNTER — Other Ambulatory Visit: Payer: Self-pay | Admitting: Internal Medicine

## 2018-12-28 DIAGNOSIS — G4733 Obstructive sleep apnea (adult) (pediatric): Secondary | ICD-10-CM

## 2018-12-28 NOTE — Progress Notes (Signed)
amb referral placed to Dr. Ron Parker for treatment of sleep apnea.

## 2019-01-17 ENCOUNTER — Ambulatory Visit: Payer: 59 | Admitting: Internal Medicine

## 2019-04-17 ENCOUNTER — Other Ambulatory Visit: Payer: Self-pay | Admitting: Family Medicine

## 2019-04-17 DIAGNOSIS — I10 Essential (primary) hypertension: Secondary | ICD-10-CM

## 2019-04-20 ENCOUNTER — Other Ambulatory Visit: Payer: Self-pay

## 2019-04-20 ENCOUNTER — Other Ambulatory Visit (INDEPENDENT_AMBULATORY_CARE_PROVIDER_SITE_OTHER): Payer: 59

## 2019-04-20 ENCOUNTER — Ambulatory Visit: Payer: 59 | Admitting: Internal Medicine

## 2019-04-20 DIAGNOSIS — I10 Essential (primary) hypertension: Secondary | ICD-10-CM

## 2019-04-20 LAB — LIPID PANEL
Cholesterol: 213 mg/dL — ABNORMAL HIGH (ref 0–200)
HDL: 36.6 mg/dL — ABNORMAL LOW (ref >39.00)
LDL Cholesterol: 143 mg/dL — ABNORMAL HIGH (ref 0–99)
NonHDL: 176.72
Total CHOL/HDL Ratio: 6
Triglycerides: 170 mg/dL — ABNORMAL HIGH (ref 0.0–149.0)
VLDL: 34 mg/dL (ref 0.0–40.0)

## 2019-04-20 LAB — COMPREHENSIVE METABOLIC PANEL
ALT: 25 U/L (ref 0–53)
AST: 19 U/L (ref 0–37)
Albumin: 4.1 g/dL (ref 3.5–5.2)
Alkaline Phosphatase: 53 U/L (ref 39–117)
BUN: 13 mg/dL (ref 6–23)
CO2: 32 mEq/L (ref 19–32)
Calcium: 8.9 mg/dL (ref 8.4–10.5)
Chloride: 104 mEq/L (ref 96–112)
Creatinine, Ser: 0.91 mg/dL (ref 0.40–1.50)
GFR: 90.33 mL/min (ref >60.00)
Glucose, Bld: 91 mg/dL (ref 70–99)
Potassium: 3.9 mEq/L (ref 3.5–5.1)
Sodium: 141 mEq/L (ref 135–145)
Total Bilirubin: 0.6 mg/dL (ref 0.2–1.2)
Total Protein: 6.2 g/dL (ref 6.0–8.3)

## 2019-04-24 ENCOUNTER — Other Ambulatory Visit: Payer: Self-pay

## 2019-04-24 ENCOUNTER — Encounter: Payer: Self-pay | Admitting: Family Medicine

## 2019-04-24 ENCOUNTER — Ambulatory Visit (INDEPENDENT_AMBULATORY_CARE_PROVIDER_SITE_OTHER): Payer: 59 | Admitting: Family Medicine

## 2019-04-24 VITALS — BP 132/78 | HR 76 | Temp 97.9°F | Ht 74.0 in | Wt 262.9 lb

## 2019-04-24 DIAGNOSIS — Z7189 Other specified counseling: Secondary | ICD-10-CM

## 2019-04-24 DIAGNOSIS — Z23 Encounter for immunization: Secondary | ICD-10-CM

## 2019-04-24 DIAGNOSIS — Z0000 Encounter for general adult medical examination without abnormal findings: Secondary | ICD-10-CM

## 2019-04-24 DIAGNOSIS — I10 Essential (primary) hypertension: Secondary | ICD-10-CM

## 2019-04-24 DIAGNOSIS — G4733 Obstructive sleep apnea (adult) (pediatric): Secondary | ICD-10-CM

## 2019-04-24 MED ORDER — LISINOPRIL 40 MG PO TABS: 40.0000 mg | ORAL_TABLET | Freq: Every day | ORAL | 12 refills | Status: DC

## 2019-04-24 MED ORDER — HYDROCHLOROTHIAZIDE 25 MG PO TABS: 25.0000 mg | ORAL_TABLET | Freq: Every day | ORAL | 12 refills | Status: DC

## 2019-04-24 NOTE — Progress Notes (Signed)
CPE- See plan.  Routine anticipatory guidance given to patient.  See health maintenance.  The possibility exists that previously documented standard health maintenance information may have been brought forward from a previous encounter into this note.  If needed, that same information has been updated to reflect the current situation based on today's encounter.    Tetanus 2012  Flu today.   PNA and shingles shot not due.  Prostate and colon cancer screening not due.  Diet and exercise d/w pt. He is working on both.  Tobacco use d/w pt.  Encouraged cessation.  more on the weekend.   Living will d/w pt. Wife would be designated if patient were incapacitated.  Hypertension:    Using medication without problems or lightheadedness: yes Chest pain with exertion:no Edema:no Short of breath:no Labs d/w pt.    Sleep study d/w pt. He tried mouth piece for OSA and isn't snoring as much per report (patient's wife told him he wasn't snoring as much).  He feels better with use.  Sleeping well.  Not waking gasping for air.  He has f/u pending once he is used to the mouthguard.    PMH and SH reviewed  Meds, vitals, and allergies reviewed.   ROS: Per HPI.  Unless specifically indicated otherwise in HPI, the patient denies:  General: fever. Eyes: acute vision changes ENT: sore throat Cardiovascular: chest pain Respiratory: SOB GI: vomiting GU: dysuria Musculoskeletal: acute back pain Derm: acute rash Neuro: acute motor dysfunction Psych: worsening mood Endocrine: polydipsia Heme: bleeding Allergy: hayfever  GEN: nad, alert and oriented HEENT: ncat NECK: supple w/o LA CV: rrr. PULM: ctab, no inc wob ABD: soft, +bs EXT: no edema SKIN: no acute rash Small umbilical hernia noted after lifting.    The 10-year ASCVD risk score Mikey Bussing DC Brooke Bonito., et al., 2013) is: 10.2%   Values used to calculate the score:     Age: 43 years     Sex: Male     Is Non-Hispanic African American: No  Diabetic: No     Tobacco smoker: Yes     Systolic Blood Pressure: Q000111Q mmHg     Is BP treated: Yes     HDL Cholesterol: 36.6 mg/dL     Total Cholesterol: 213 mg/dL   Smoking cessation would likely be the most important single intervention to lower his risk, d/w pt.

## 2019-04-24 NOTE — Patient Instructions (Signed)
If you have more trouble with your belly button, then let me know.  Don't change your meds for now.  Update me as needed.  Take care.  Glad to see you.

## 2019-04-26 NOTE — Assessment & Plan Note (Addendum)
  The 10-year ASCVD risk score Mikey Bussing DC Brooke Bonito., et al., 2013) is: 10.2%   Values used to calculate the score:     Age: 43 years     Sex: Male     Is Non-Hispanic African American: No     Diabetic: No     Tobacco smoker: Yes     Systolic Blood Pressure: Q000111Q mmHg     Is BP treated: Yes     HDL Cholesterol: 36.6 mg/dL     Total Cholesterol: 213 mg/dL   Smoking cessation would likely be the most important single intervention to lower his risk, d/w pt.    Discussed diet and exercise.  No change in meds at this point.  He agrees.

## 2019-04-26 NOTE — Assessment & Plan Note (Signed)
Sleep study d/w pt. He tried mouth piece for OSA and isn't snoring as much per report (patient's wife told him he wasn't snoring as much).  He feels better with use.  Sleeping well.  Not waking gasping for air.  He has f/u pending once he is used to the mouthguard.

## 2019-04-26 NOTE — Assessment & Plan Note (Signed)
Tetanus 2012  Flu today.   PNA and shingles shot not due.  Prostate and colon cancer screening not due.  Diet and exercise d/w pt. He is working on both.  Tobacco use d/w pt.  Encouraged cessation.  more on the weekend.   Living will d/w pt. Wife would be designated if patient were incapacitated.

## 2019-05-08 ENCOUNTER — Telehealth: Payer: Self-pay | Admitting: *Deleted

## 2019-05-08 ENCOUNTER — Other Ambulatory Visit: Payer: Self-pay

## 2019-05-08 DIAGNOSIS — Z20822 Contact with and (suspected) exposure to covid-19: Secondary | ICD-10-CM

## 2019-05-08 NOTE — Telephone Encounter (Signed)
Patient called wanting advice about covid. Patient stated [place] and started getting sick last Monday. Patient stated that he has had a cough and congestion now for a week. Patient denies any fever, SOB or difficulty breathing. Patient stated the only symptoms that he has is cough and congestion,  Advised patient the only way we can rule out covid is for him to go be tested. Patient is going to [facility] today to be tested for Covid. Advise patient if he develops a high fever, difficulty breathing or SOB he should go to the ER and he verbalized understanding. Patient was advised that he and his family needs to self quarantine until the results are back and he verbalized understanding.

## 2019-05-09 LAB — NOVEL CORONAVIRUS, NAA: SARS-CoV-2, NAA: NOT DETECTED

## 2019-05-09 LAB — SPECIMEN STATUS REPORT

## 2019-05-09 NOTE — Telephone Encounter (Signed)
I see that his lab result is pending.  Please get update on patient.  Thanks.

## 2019-05-09 NOTE — Telephone Encounter (Signed)
Patient has not been notified of results yet.  He only has cough and congestion, no worse than previously.

## 2019-05-10 NOTE — Telephone Encounter (Signed)
Notify patient.  Per EMR, COVID test is negative.  Update [place] as needed.  Thanks.

## 2019-05-11 NOTE — Telephone Encounter (Signed)
Patient advised.

## 2019-08-09 ENCOUNTER — Ambulatory Visit: Payer: 59 | Attending: Internal Medicine

## 2019-08-09 DIAGNOSIS — Z20822 Contact with and (suspected) exposure to covid-19: Secondary | ICD-10-CM

## 2019-08-10 LAB — NOVEL CORONAVIRUS, NAA: SARS-CoV-2, NAA: NOT DETECTED

## 2019-08-15 ENCOUNTER — Encounter: Payer: Self-pay | Admitting: Family Medicine

## 2019-08-15 ENCOUNTER — Other Ambulatory Visit: Payer: Self-pay

## 2019-08-15 ENCOUNTER — Ambulatory Visit: Payer: 59 | Admitting: Family Medicine

## 2019-08-15 DIAGNOSIS — M542 Cervicalgia: Secondary | ICD-10-CM | POA: Insufficient documentation

## 2019-08-15 MED ORDER — PREDNISONE 20 MG PO TABS: ORAL_TABLET | ORAL | 0 refills | Status: DC

## 2019-08-15 MED ORDER — METHOCARBAMOL 500 MG PO TABS: 500.0000 mg | ORAL_TABLET | Freq: Three times a day (TID) | ORAL | 0 refills | Status: DC | PRN

## 2019-08-15 NOTE — Assessment & Plan Note (Addendum)
Acute R sided neck pain with R arm/hand paresthesias. Anticipate cervical radiculopathy from possible pinched nerve. Neurological exam benign today. Treat with prednisone, tylenol, muscle relaxant, provided with stretching exercises for neck, update if not improving with treatment, consider cervical films and PT course. Discussed firm pillow use. Pt agrees with plan.

## 2019-08-15 NOTE — Patient Instructions (Addendum)
You may have pinched nerve of neck - treat with gentle stretching (exercises provided today), heating pad to neck, may continue tylenol, add muscle relaxant robaxin and prednisone steroid course.  Let us know if worsening or not improving as expected for further evaluation/xray, considering PT course.

## 2019-08-15 NOTE — Progress Notes (Signed)
This visit was conducted in person.  BP (!) 144/82 (BP Location: Left Arm, Patient Position: Sitting, Cuff Size: Large)   Pulse 83   Temp (!) 97 F (36.1 C) (Temporal)   Ht 6\' 2"  (1.88 m)   Wt 265 lb 11.2 oz (120.5 kg)   SpO2 97%   BMI 34.11 kg/m    CC: neck pain Subjective:    Patient ID: Kent Landry, male    DOB: [birth year], 44 y.o.   MRN: [id number]  HPI: Kent Landry is a 44 y.o. male presenting on 08/15/2019 for Neck Pain (neck pain x 2 weeks, tingling and pain down right neck and arm. Worse at night. Pt notices when looking up and reaching over his head he will have tingling. Denies visual disturbances or headache)   R handed  Several week h/o worsening neck pain, most noticeable when sleeping on his side at night. Worse with neck extension. Yesterday felt tingling numbness when lifting R arm above had. Does keep strange sensation to R hand.   Denies inciting trauma/injury or falls. No shooting pain down arms. No numbness weakness of arms/hands.   Treating with tylenol 1000mg  at night.  No prior h/o neck pain.   Did have lumbar disc herniation 2003, improved with PT  HTN on lisinopril and hctz.      Relevant past medical, surgical, family and social history reviewed and updated as indicated. Interim medical history since our last visit reviewed. Allergies and medications reviewed and updated. Outpatient Medications Prior to Visit  Medication Sig Dispense Refill  . fexofenadine (ALLEGRA) 60 MG tablet Take 60 mg by mouth daily. OTC medication    . fluticasone (FLONASE) 50 MCG/ACT nasal spray USE 2 SPRAYS IN EACH NOSTRIL DAILY 16 g 12  . hydrochlorothiazide (HYDRODIURIL) 25 MG tablet Take 1 tablet (25 mg total) by mouth daily. 30 tablet 12  . lisinopril (ZESTRIL) 40 MG tablet Take 1 tablet (40 mg total) by mouth daily. 30 tablet 12  . Multiple Vitamin (MULTIVITAMIN) tablet Take 1 tablet by mouth daily.       No facility-administered medications prior to visit.      Per HPI unless specifically indicated in ROS section below Review of Systems Objective:    BP (!) 144/82 (BP Location: Left Arm, Patient Position: Sitting, Cuff Size: Large)   Pulse 83   Temp (!) 97 F (36.1 C) (Temporal)   Ht 6\' 2"  (1.88 m)   Wt 265 lb 11.2 oz (120.5 kg)   SpO2 97%   BMI 34.11 kg/m   Wt Readings from Last 3 Encounters:  08/15/19 265 lb 11.2 oz (120.5 kg)  04/24/19 262 lb 14.4 oz (119.3 kg)  10/17/18 258 lb 3.2 oz (117.1 kg)    Physical Exam Vitals and nursing note reviewed.  Constitutional:      Appearance: Normal appearance. He is not ill-appearing.  Neck:     Comments:  FROM cervical spine with reproducible discomfort with neck extension as well as lateral flexion and lateral rotation to the right No midline cervical spine pain, no paracervical mm tenderness, no pain at rhomboids Tightness at R trap mm noted Musculoskeletal:        General: No swelling or tenderness.     Cervical back: Neck supple. No rigidity or tenderness. Decreased range of motion.  Neurological:     General: No focal deficit present.     Mental Status: He is alert.     Sensory: Sensation is intact.     Motor:  Motor function is intact.     Deep Tendon Reflexes:     Reflex Scores:      Bicep reflexes are 2+ on the right side and 2+ on the left side.    Comments:  Sensation intact to light touch and temperature 5/5 strength BUE, grip strength intact  Psychiatric:        Mood and Affect: Mood normal.        Behavior: Behavior normal.       Results for orders placed or performed in visit on 08/09/19  Novel Coronavirus, NAA ([facility])   Specimen: Nasopharyngeal(NP) swabs in vial transport medium   NASOPHARYNGE  TESTING  Result Value Ref Range   SARS-CoV-2, NAA Not Detected Not Detected   Assessment & Plan:  This visit occurred during the SARS-CoV-2 public health emergency.  Safety protocols were in place, including screening questions prior to the visit, additional usage of  staff PPE, and extensive cleaning of exam room while observing appropriate contact time as indicated for disinfecting solutions.   Problem List Items Addressed This Visit    Neck pain on right side    Acute R sided neck pain with R arm/hand paresthesias. Anticipate cervical radiculopathy from possible pinched nerve. Neurological exam benign today. Treat with prednisone, tylenol, muscle relaxant, provided with stretching exercises for neck, update if not improving with treatment, consider cervical films and PT course. Discussed firm pillow use. Pt agrees with plan.           Meds ordered this encounter  Medications  . predniSONE (DELTASONE) 20 MG tablet    Sig: Take two tablets daily for 4 days followed by one tablet daily for 4 days    Dispense:  12 tablet    Refill:  0  . methocarbamol (ROBAXIN) 500 MG tablet    Sig: Take 1 tablet (500 mg total) by mouth 3 (three) times daily as needed for muscle spasms (sedation precautions).    Dispense:  30 tablet    Refill:  0   No orders of the defined types were placed in this encounter.   Patient Instructions  You may have pinched nerve of neck - treat with gentle stretching (exercises provided today), heating pad to neck, may continue tylenol, add muscle relaxant robaxin and prednisone steroid course.  Let us know if worsening or not improving as expected for further evaluation/xray, considering PT course.    Follow up plan: Return if symptoms worsen or fail to improve.  Ria Bush, MD

## 2019-08-23 ENCOUNTER — Telehealth: Payer: Self-pay | Admitting: *Deleted

## 2019-08-23 NOTE — Telephone Encounter (Signed)
There are several options here.  If he is clearly improving in the meantime and the residual numbness is getting better, then I would give this more time.  If his progress has stalled or if he is getting worse in the meantime that I think it makes sense to get rechecked here in the clinic, sooner rather than later.  Please schedule as needed.  Thanks.

## 2019-08-23 NOTE — Telephone Encounter (Signed)
Agree. Recommend continued gentle stretching of neck. If worsening symptoms, would offer xrays as well.

## 2019-08-23 NOTE — Telephone Encounter (Signed)
Patient left a voicemail stating that he was seen recently for a possible pinched nerve. Patient stated that he has finished the medication and the pain is better, but still having tingling in his arm. Patient wants to know what the next step would be and requested a call back?

## 2019-08-24 ENCOUNTER — Encounter: Payer: Self-pay | Admitting: Family Medicine

## 2019-08-24 ENCOUNTER — Other Ambulatory Visit: Payer: Self-pay

## 2019-08-24 ENCOUNTER — Ambulatory Visit: Payer: Self-pay | Admitting: Family Medicine

## 2019-08-24 ENCOUNTER — Ambulatory Visit (INDEPENDENT_AMBULATORY_CARE_PROVIDER_SITE_OTHER)
Admission: RE | Admit: 2019-08-24 | Discharge: 2019-08-24 | Disposition: A | Discharge status: Home / Self Care | Payer: BC Managed Care – PPO | Source: Ambulatory Visit | Attending: Family Medicine | Admitting: Family Medicine

## 2019-08-24 VITALS — BP 130/70 | HR 75 | Temp 97.2°F | Ht 72.0 in | Wt 262.3 lb

## 2019-08-24 DIAGNOSIS — R202 Paresthesia of skin: Secondary | ICD-10-CM | POA: Diagnosis not present

## 2019-08-24 DIAGNOSIS — M542 Cervicalgia: Secondary | ICD-10-CM

## 2019-08-24 MED ORDER — PREDNISONE 20 MG PO TABS: ORAL_TABLET | ORAL | 0 refills | Status: DC

## 2019-08-24 MED ORDER — CYCLOBENZAPRINE HCL 10 MG PO TABS: 5.0000 mg | ORAL_TABLET | Freq: Every evening | ORAL | 0 refills | Status: DC | PRN

## 2019-08-24 NOTE — Telephone Encounter (Signed)
Patient is scheduled for a visit today.  Nyara Capell,cma

## 2019-08-24 NOTE — Telephone Encounter (Signed)
Lvm for pt to call back.  Need to relay Dr. Damita Dunnings and Dr. Synthia Innocent messages.  Schedule pt as needed, per Dr. Damita Dunnings.

## 2019-08-24 NOTE — Telephone Encounter (Signed)
Noted  

## 2019-08-24 NOTE — Progress Notes (Signed)
This visit occurred during the SARS-CoV-2 public health emergency.  Safety protocols were in place, including screening questions prior to the visit, additional usage of staff PPE, and extensive cleaning of exam room while observing appropriate contact time as indicated for disinfecting solutions.  R arm sx. No trigger.  Had pain down the R side of the neck, then it got worse when reaching and looking up.  Now with constant altered but not absent sensation in the R hand, less in the arm.    Done with prednisone now.  Robaxin didn't help much.  He has change in sensation to the R palm and forearm but not the dorsum on the R hand.  No weakness.  His neck is better, that seems to have gotten better with prednisone.  The numbness is worse, not constant, prev it was positional.    No leg or L arm sx.  No FCNAVD.    Meds, vitals, and allergies reviewed.   ROS: Per HPI unless specifically indicated in ROS section   GEN: nad, alert and oriented HEENT: ncat NECK: supple w/o LA, not tender to palpation. CV: rrr.  EXT: no edema SKIN: no acute rash Altered but not absent sensation on the right palm and forearm.  Normal grip.  No focal weakness in the right upper extremity.  Normal grip bilaterally.  Reflexes symmetric bilaterally.  Strength and sensation grossly normal for the bilateral lower extremities.

## 2019-08-24 NOTE — Patient Instructions (Signed)
Go to the lab on the way out.  We'll contact you with your xray report.  Stop robaxin.  Change to cyclobenzaprine.  Sedation caution.   Restart prednisone with food.  We'll call about seeing PT.  Take care.  Glad to see you.

## 2019-08-27 NOTE — Assessment & Plan Note (Signed)
Discussed options Stop robaxin.  Change to cyclobenzaprine.  Sedation caution.  Restart prednisone with food.  We'll call about seeing PT.  See notes on imaging.  He agrees with plan.  Still okay for outpatient follow-up.

## 2019-08-28 ENCOUNTER — Telehealth: Payer: Self-pay | Admitting: Family Medicine

## 2019-08-28 ENCOUNTER — Encounter: Payer: Self-pay | Admitting: Family Medicine

## 2019-08-28 NOTE — Telephone Encounter (Signed)
Patient called in regards to his referral  He stated he wanted to go to [facility] in [place]  Dr Angelia Mould   Patient stated any day was fine but would prefer mornings if possible

## 2019-09-01 DIAGNOSIS — M542 Cervicalgia: Secondary | ICD-10-CM | POA: Diagnosis not present

## 2019-09-01 DIAGNOSIS — M79601 Pain in right arm: Secondary | ICD-10-CM | POA: Diagnosis not present

## 2019-09-01 DIAGNOSIS — M50122 Cervical disc disorder at C5-C6 level with radiculopathy: Secondary | ICD-10-CM | POA: Diagnosis not present

## 2019-09-05 DIAGNOSIS — M542 Cervicalgia: Secondary | ICD-10-CM | POA: Diagnosis not present

## 2019-09-05 DIAGNOSIS — M79601 Pain in right arm: Secondary | ICD-10-CM | POA: Diagnosis not present

## 2019-09-05 DIAGNOSIS — M50122 Cervical disc disorder at C5-C6 level with radiculopathy: Secondary | ICD-10-CM | POA: Diagnosis not present

## 2019-09-07 DIAGNOSIS — M79601 Pain in right arm: Secondary | ICD-10-CM | POA: Diagnosis not present

## 2019-09-07 DIAGNOSIS — M50122 Cervical disc disorder at C5-C6 level with radiculopathy: Secondary | ICD-10-CM | POA: Diagnosis not present

## 2019-09-07 DIAGNOSIS — M542 Cervicalgia: Secondary | ICD-10-CM | POA: Diagnosis not present

## 2019-09-12 DIAGNOSIS — M50122 Cervical disc disorder at C5-C6 level with radiculopathy: Secondary | ICD-10-CM | POA: Diagnosis not present

## 2019-09-12 DIAGNOSIS — M542 Cervicalgia: Secondary | ICD-10-CM | POA: Diagnosis not present

## 2019-09-12 DIAGNOSIS — M79601 Pain in right arm: Secondary | ICD-10-CM | POA: Diagnosis not present

## 2019-09-15 DIAGNOSIS — M50122 Cervical disc disorder at C5-C6 level with radiculopathy: Secondary | ICD-10-CM | POA: Diagnosis not present

## 2019-09-15 DIAGNOSIS — M542 Cervicalgia: Secondary | ICD-10-CM | POA: Diagnosis not present

## 2019-09-15 DIAGNOSIS — M79601 Pain in right arm: Secondary | ICD-10-CM | POA: Diagnosis not present

## 2019-10-15 ENCOUNTER — Ambulatory Visit: Payer: BC Managed Care – PPO | Attending: Internal Medicine

## 2019-10-15 DIAGNOSIS — Z23 Encounter for immunization: Secondary | ICD-10-CM | POA: Insufficient documentation

## 2019-10-15 NOTE — Progress Notes (Signed)
   Covid-19 Vaccination Clinic  Name:  Monterrius Hafler    MRN: [id number] DOB: 14-Nov-1975  10/15/2019  Mr. Winkowski was observed post Covid-19 immunization for 15 minutes without incident. He was provided with Vaccine Information Sheet and instruction to access the V-Safe system.   Mr. Stormer was instructed to call 911 with any severe reactions post vaccine: Marland Kitchen Difficulty breathing  . Swelling of face and throat  . A fast heartbeat  . A bad rash all over body  . Dizziness and weakness   Immunizations Administered    Name Date Dose VIS Date Route   Pfizer COVID-19 Vaccine 10/15/2019  1:57 PM 0.3 mL 07/21/2019 Intramuscular   Manufacturer: [place]   Lot: [id number]   [place]: [id number]

## 2019-11-15 ENCOUNTER — Ambulatory Visit: Payer: BC Managed Care – PPO | Attending: Internal Medicine

## 2019-11-15 DIAGNOSIS — Z23 Encounter for immunization: Secondary | ICD-10-CM

## 2019-11-15 NOTE — Progress Notes (Signed)
   Covid-19 Vaccination Clinic  Name:  Rett Singleton    MRN: [id number] DOB: 1975-12-24  11/15/2019  Mr. Willenborg was observed post Covid-19 immunization for 15 minutes without incident. He was provided with Vaccine Information Sheet and instruction to access the V-Safe system.   Mr. Marcheschi was instructed to call 911 with any severe reactions post vaccine: Marland Kitchen Difficulty breathing  . Swelling of face and throat  . A fast heartbeat  . A bad rash all over body  . Dizziness and weakness   Immunizations Administered    Name Date Dose VIS Date Route   Pfizer COVID-19 Vaccine 11/15/2019  8:27 AM 0.3 mL 07/21/2019 Intramuscular   Manufacturer: [place]   Lot: [id number]   [place]: [id number]

## 2020-04-17 ENCOUNTER — Other Ambulatory Visit: Payer: Self-pay | Admitting: Family Medicine

## 2020-04-17 DIAGNOSIS — I10 Essential (primary) hypertension: Secondary | ICD-10-CM

## 2020-04-18 ENCOUNTER — Other Ambulatory Visit: Payer: Self-pay

## 2020-04-18 ENCOUNTER — Other Ambulatory Visit (INDEPENDENT_AMBULATORY_CARE_PROVIDER_SITE_OTHER): Payer: BC Managed Care – PPO

## 2020-04-18 DIAGNOSIS — I10 Essential (primary) hypertension: Secondary | ICD-10-CM | POA: Diagnosis not present

## 2020-04-18 LAB — LIPID PANEL
Cholesterol: 208 mg/dL — ABNORMAL HIGH (ref 0–200)
HDL: 39.8 mg/dL (ref >39.00)
LDL Cholesterol: 140 mg/dL — ABNORMAL HIGH (ref 0–99)
NonHDL: 168.47
Total CHOL/HDL Ratio: 5
Triglycerides: 143 mg/dL (ref 0.0–149.0)
VLDL: 28.6 mg/dL (ref 0.0–40.0)

## 2020-04-18 LAB — COMPREHENSIVE METABOLIC PANEL
ALT: 21 U/L (ref 0–53)
AST: 19 U/L (ref 0–37)
Albumin: 4.3 g/dL (ref 3.5–5.2)
Alkaline Phosphatase: 59 U/L (ref 39–117)
BUN: 15 mg/dL (ref 6–23)
CO2: 30 mEq/L (ref 19–32)
Calcium: 8.9 mg/dL (ref 8.4–10.5)
Chloride: 104 mEq/L (ref 96–112)
Creatinine, Ser: 0.91 mg/dL (ref 0.40–1.50)
GFR: 89.92 mL/min (ref >60.00)
Glucose, Bld: 95 mg/dL (ref 70–99)
Potassium: 3.4 mEq/L — ABNORMAL LOW (ref 3.5–5.1)
Sodium: 140 mEq/L (ref 135–145)
Total Bilirubin: 0.6 mg/dL (ref 0.2–1.2)
Total Protein: 6.5 g/dL (ref 6.0–8.3)

## 2020-04-25 ENCOUNTER — Encounter: Payer: 59 | Admitting: Family Medicine

## 2020-05-07 ENCOUNTER — Other Ambulatory Visit: Payer: Self-pay

## 2020-05-07 ENCOUNTER — Encounter: Payer: Self-pay | Admitting: Family Medicine

## 2020-05-07 ENCOUNTER — Ambulatory Visit (INDEPENDENT_AMBULATORY_CARE_PROVIDER_SITE_OTHER): Payer: BC Managed Care – PPO | Admitting: Family Medicine

## 2020-05-07 VITALS — BP 150/76 | HR 84 | Temp 97.1°F | Ht 72.0 in | Wt 265.1 lb

## 2020-05-07 DIAGNOSIS — Z7189 Other specified counseling: Secondary | ICD-10-CM

## 2020-05-07 DIAGNOSIS — Z23 Encounter for immunization: Secondary | ICD-10-CM

## 2020-05-07 DIAGNOSIS — I10 Essential (primary) hypertension: Secondary | ICD-10-CM

## 2020-05-07 DIAGNOSIS — Z0000 Encounter for general adult medical examination without abnormal findings: Secondary | ICD-10-CM

## 2020-05-07 MED ORDER — LISINOPRIL 40 MG PO TABS
40.0000 mg | ORAL_TABLET | Freq: Every day | ORAL | 3 refills | Status: DC
Start: 2020-05-07 — End: 2021-05-08

## 2020-05-07 MED ORDER — HYDROCHLOROTHIAZIDE 25 MG PO TABS
25.0000 mg | ORAL_TABLET | Freq: Every day | ORAL | 3 refills | Status: DC
Start: 2020-05-07 — End: 2021-05-08

## 2020-05-07 NOTE — Progress Notes (Signed)
This visit occurred during the SARS-CoV-2 public health emergency.  Safety protocols were in place, including screening questions prior to the visit, additional usage of staff PPE, and extensive cleaning of exam room while observing appropriate contact time as indicated for disinfecting solutions.  CPE- See plan.  Routine anticipatory guidance given to patient.  See health maintenance.  The possibility exists that previously documented standard health maintenance information may have been brought forward from a previous encounter into this note.  If needed, that same information has been updated to reflect the current situation based on today's encounter.    Tetanus 2012  Flu today.  PNA and shingles shot not due.  covid vaccine done 2021.   Prostate and colon cancer screening not due.  Diet and exercise d/w pt.  Tobacco use d/w pt. Encouraged cessation. More on the weekend.   Living will d/w pt. Wife would be designated if patient were incapacitated.  OSA clearly better with mouthguard.  Not snoring.  Compliant.    Hypertension:    Using medication without problems or lightheadedness:  yes Chest pain with exertion:no Edema:no Short of breath:no Labs d/w pt.   PMH and SH reviewed  Meds, vitals, and allergies reviewed.   ROS: Per HPI.  Unless specifically indicated otherwise in HPI, the patient denies:  General: fever. Eyes: acute vision changes ENT: sore throat Cardiovascular: chest pain Respiratory: SOB GI: vomiting GU: dysuria Musculoskeletal: acute back pain Derm: acute rash Neuro: acute motor dysfunction Psych: worsening mood Endocrine: polydipsia Heme: bleeding Allergy: hayfever  GEN: nad, alert and oriented HEENT: ncat NECK: supple w/o LA CV: rrr. PULM: ctab, no inc wob ABD: soft, +bs EXT: no edema SKIN: no acute rash

## 2020-05-07 NOTE — Patient Instructions (Addendum)
Recheck your BP at home and update me if persistently >140/>90. Cut back on salt.  Take care.  Glad to see you.

## 2020-05-08 NOTE — Assessment & Plan Note (Signed)
Tetanus 2012  Flu today.  PNA and shingles shot not due.  covid vaccine done 2021.   Prostate and colon cancer screening not due.  Diet and exercise d/w pt.  Tobacco use d/w pt. Encouraged cessation. More on the weekend.   Living will d/w pt. Wife would be designated if patient were incapacitated.  OSA clearly better with mouthguard.  Not snoring.  Compliant.

## 2020-05-08 NOTE — Assessment & Plan Note (Signed)
He is going to recheck BP at home and update me if persistently >140/>90. He will work to cut back on salt.  He will keep working on diet and exercise.  He just found out that at home water softener is likely increasing the salt content of his tap water.  He is making changes related to that.  No change in lisinopril hydrochlorothiazide at this point.

## 2020-06-24 ENCOUNTER — Telehealth: Payer: Self-pay | Admitting: Family Medicine

## 2020-06-24 NOTE — Telephone Encounter (Signed)
Form done.  Please update patient.  Thanks.

## 2020-06-24 NOTE — Telephone Encounter (Signed)
I didn't see the form in the tower.  Please pull it for me.  Thanks.

## 2020-06-24 NOTE — Telephone Encounter (Signed)
Gave form to provider. Awaiting response.

## 2020-06-24 NOTE — Telephone Encounter (Signed)
Called pt and informed form done. In front yellow folders for pick up. Stated he will come back tomorrow morning.

## 2020-06-24 NOTE — Telephone Encounter (Signed)
Patient stopped by office and is wanting the physical form to be filled out asap.Please advise. Placed in tower.

## 2020-09-02 DIAGNOSIS — Z03818 Encounter for observation for suspected exposure to other biological agents ruled out: Secondary | ICD-10-CM | POA: Diagnosis not present

## 2021-01-12 DIAGNOSIS — Z20822 Contact with and (suspected) exposure to covid-19: Secondary | ICD-10-CM | POA: Diagnosis not present

## 2021-04-20 ENCOUNTER — Other Ambulatory Visit: Payer: Self-pay | Admitting: Family Medicine

## 2021-04-20 DIAGNOSIS — I10 Essential (primary) hypertension: Secondary | ICD-10-CM

## 2021-04-30 ENCOUNTER — Other Ambulatory Visit: Payer: BC Managed Care – PPO

## 2021-05-08 ENCOUNTER — Encounter: Payer: Self-pay | Admitting: Family Medicine

## 2021-05-08 ENCOUNTER — Ambulatory Visit (INDEPENDENT_AMBULATORY_CARE_PROVIDER_SITE_OTHER): Payer: BC Managed Care – PPO | Admitting: Family Medicine

## 2021-05-08 ENCOUNTER — Other Ambulatory Visit: Payer: Self-pay

## 2021-05-08 VITALS — BP 138/84 | HR 73 | Temp 97.6°F | Ht 72.0 in | Wt 264.0 lb

## 2021-05-08 DIAGNOSIS — Z0000 Encounter for general adult medical examination without abnormal findings: Secondary | ICD-10-CM | POA: Diagnosis not present

## 2021-05-08 DIAGNOSIS — F17200 Nicotine dependence, unspecified, uncomplicated: Secondary | ICD-10-CM

## 2021-05-08 DIAGNOSIS — I10 Essential (primary) hypertension: Secondary | ICD-10-CM | POA: Diagnosis not present

## 2021-05-08 DIAGNOSIS — Z23 Encounter for immunization: Secondary | ICD-10-CM | POA: Diagnosis not present

## 2021-05-08 DIAGNOSIS — G4733 Obstructive sleep apnea (adult) (pediatric): Secondary | ICD-10-CM

## 2021-05-08 DIAGNOSIS — Z7189 Other specified counseling: Secondary | ICD-10-CM

## 2021-05-08 LAB — LIPID PANEL
Cholesterol: 221 mg/dL — ABNORMAL HIGH (ref 0–200)
HDL: 38.7 mg/dL — ABNORMAL LOW (ref >39.00)
NonHDL: 182.75
Total CHOL/HDL Ratio: 6
Triglycerides: 201 mg/dL — ABNORMAL HIGH (ref 0.0–149.0)
VLDL: 40.2 mg/dL — ABNORMAL HIGH (ref 0.0–40.0)

## 2021-05-08 LAB — COMPREHENSIVE METABOLIC PANEL
ALT: 30 U/L (ref 0–53)
AST: 23 U/L (ref 0–37)
Albumin: 4.4 g/dL (ref 3.5–5.2)
Alkaline Phosphatase: 61 U/L (ref 39–117)
BUN: 12 mg/dL (ref 6–23)
CO2: 30 mEq/L (ref 19–32)
Calcium: 9.2 mg/dL (ref 8.4–10.5)
Chloride: 102 mEq/L (ref 96–112)
Creatinine, Ser: 0.91 mg/dL (ref 0.40–1.50)
GFR: 101.1 mL/min (ref >60.00)
Glucose, Bld: 93 mg/dL (ref 70–99)
Potassium: 3.5 mEq/L (ref 3.5–5.1)
Sodium: 140 mEq/L (ref 135–145)
Total Bilirubin: 0.4 mg/dL (ref 0.2–1.2)
Total Protein: 6.7 g/dL (ref 6.0–8.3)

## 2021-05-08 LAB — LDL CHOLESTEROL, DIRECT: Direct LDL: 153 mg/dL

## 2021-05-08 MED ORDER — HYDROCHLOROTHIAZIDE 25 MG PO TABS
25.0000 mg | ORAL_TABLET | Freq: Every day | ORAL | 3 refills | Status: DC
Start: 2021-05-08 — End: 2022-05-06

## 2021-05-08 MED ORDER — LISINOPRIL 40 MG PO TABS: 40.0000 mg | ORAL_TABLET | Freq: Every day | ORAL | 3 refills | Status: DC

## 2021-05-08 MED ORDER — BUPROPION HCL ER (SR) 100 MG PO TB12: 100.0000 mg | ORAL_TABLET | Freq: Every day | ORAL | 1 refills | Status: DC

## 2021-05-08 NOTE — Progress Notes (Signed)
This visit occurred during the SARS-CoV-2 public health emergency.  Safety protocols were in place, including screening questions prior to the visit, additional usage of staff PPE, and extensive cleaning of exam room while observing appropriate contact time as indicated for disinfecting solutions.  CPE- See plan.  Routine anticipatory guidance given to patient.  See health maintenance.  The possibility exists that previously documented standard health maintenance information may have been brought forward from a previous encounter into this note.  If needed, that same information has been updated to reflect the current situation based on today's encounter.    Tetanus 2022  Flu today.   PNA and shingles shot not due.  covid vaccine done 2021.   Prostate and colon cancer screening not due.  Diet and exercise d/w pt.   Living will d/w pt. Wife would be designated if patient were incapacitated.   OSA clearly better with mouthguard.  Not snoring.  Compliant.    Smoking cessation d/w pt.  He tried chantix in the past.  Sister used wellbutrin with relief for smoking cessation.  Discussed options and routine use of Wellbutrin.  Routine cautions given to patient.  No history of seizure disorder.  Hypertension:    Using medication without problems or lightheadedness: yes Chest pain with exertion:no Edema:no Short of breath:no Labs pending.    PMH and SH reviewed  Meds, vitals, and allergies reviewed.   ROS: Per HPI.  Unless specifically indicated otherwise in HPI, the patient denies:  General: fever. Eyes: acute vision changes ENT: sore throat Cardiovascular: chest pain Respiratory: SOB GI: vomiting GU: dysuria Musculoskeletal: acute back pain Derm: acute rash Neuro: acute motor dysfunction Psych: worsening mood Endocrine: polydipsia Heme: bleeding Allergy: hayfever  GEN: nad, alert and oriented HEENT: ncat NECK: supple w/o LA CV: rrr. PULM: ctab, no inc wob ABD: soft, +bs EXT:  minimal BLE edema (almost none- only a sock line) SKIN: no acute rash

## 2021-05-08 NOTE — Telephone Encounter (Signed)
This has been printed and signed and given back to the patient while he was here for his visit.

## 2021-05-08 NOTE — Patient Instructions (Addendum)
Go to the lab on the way out.   If you have mychart we'll likely use that to update you.    Try wellbutrin in the AM and see how that goes with smoking cessation.  Update me as need.  We may need to increase the dose later but I would start with 1 a day.  Take care.  Glad to see you.

## 2021-05-11 ENCOUNTER — Other Ambulatory Visit: Payer: Self-pay | Admitting: Family Medicine

## 2021-05-11 DIAGNOSIS — E785 Hyperlipidemia, unspecified: Secondary | ICD-10-CM

## 2021-05-11 NOTE — Assessment & Plan Note (Signed)
Continue work on diet and exercise.  Continue mouthguard use.  He will update me as needed.

## 2021-05-11 NOTE — Assessment & Plan Note (Signed)
Continue hydrochlorothiazide and lisinopril.  See notes on labs.

## 2021-05-11 NOTE — Assessment & Plan Note (Signed)
Tetanus 2022  Flu today.   PNA and shingles shot not due.  covid vaccine done 2021.   Prostate and colon cancer screening not due.  Diet and exercise d/w pt.   Living will d/w pt. Wife would be designated if patient were incapacitated.

## 2021-05-11 NOTE — Assessment & Plan Note (Signed)
He can try Wellbutrin at 100 mg daily.  He can start medication and then try to quit smoking about 7 days later.  He will update me as needed.  Okay for outpatient follow-up.  He agrees with plan.

## 2021-05-12 ENCOUNTER — Telehealth: Payer: Self-pay | Admitting: Family Medicine

## 2021-05-12 NOTE — Telephone Encounter (Signed)
Patient notified of lab results and verbalized understanding.  ° °

## 2021-05-12 NOTE — Telephone Encounter (Signed)
Kent Landry called in returning [place] phone call for lab results.

## 2021-06-24 IMAGING — DX DG CERVICAL SPINE COMPLETE 4+V
6 series · 6 of 6 positions shown · non-contrast
Comparison: None.

CLINICAL DATA: Right neck pain and paresthesias

EXAM:
CERVICAL SPINE - COMPLETE 4+ VIEW

[c-spine lat]
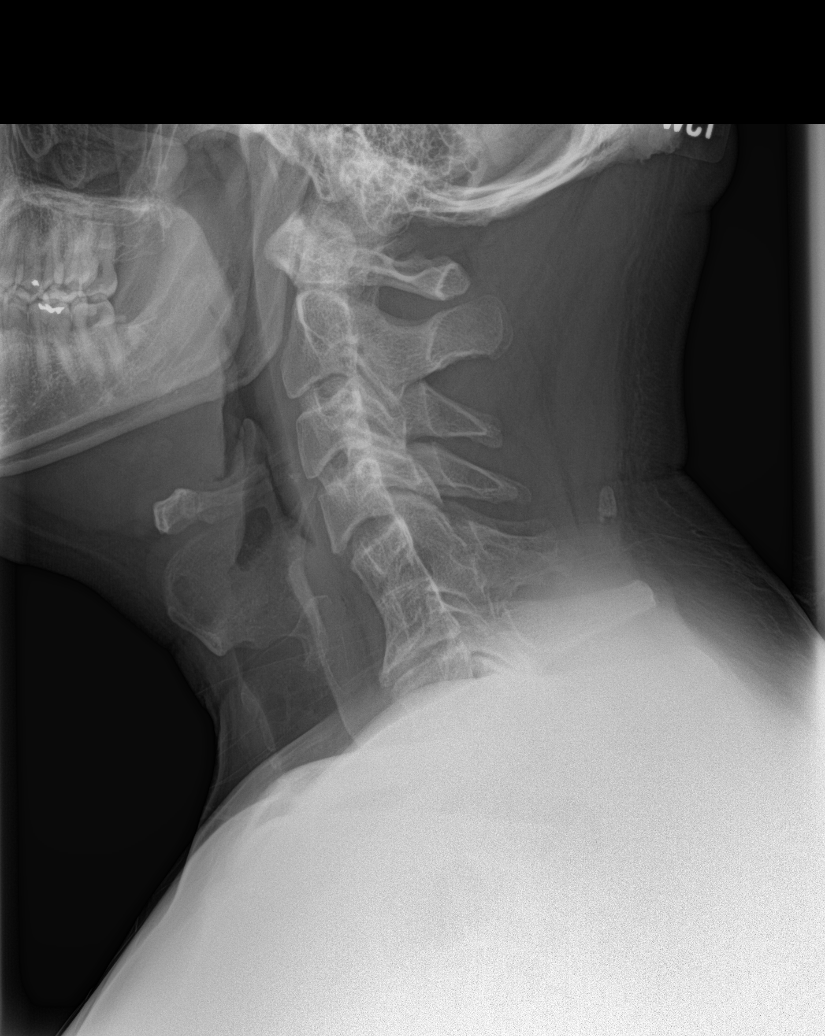

[c-spine obl (1 of 2)]
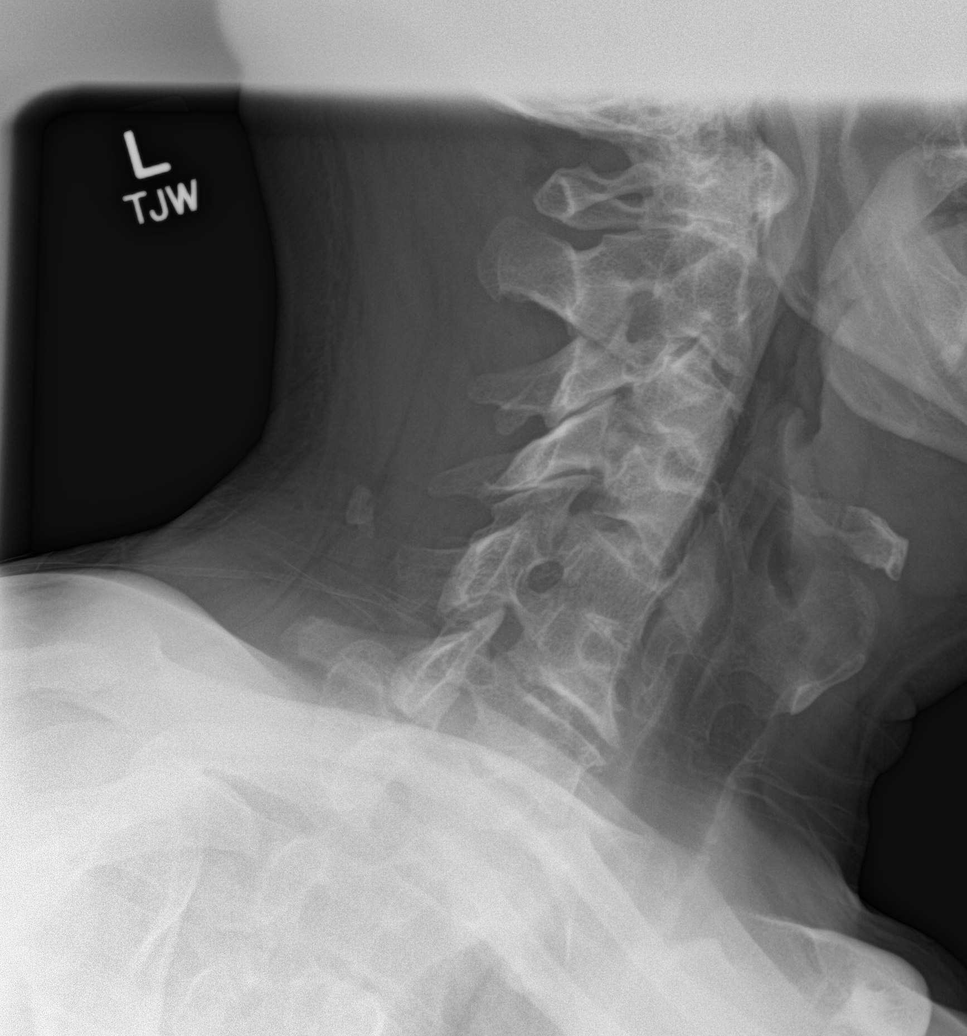

[c-spine obl (2 of 2)]
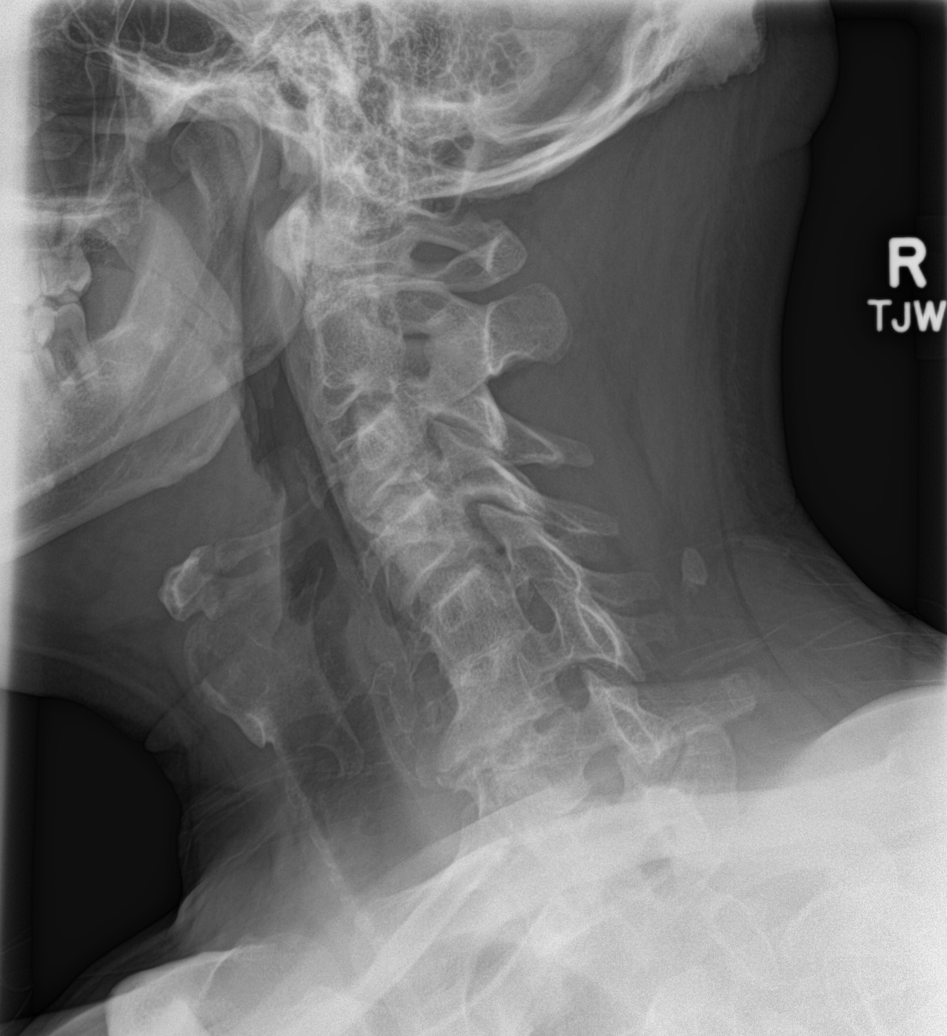

[c-spine ap]
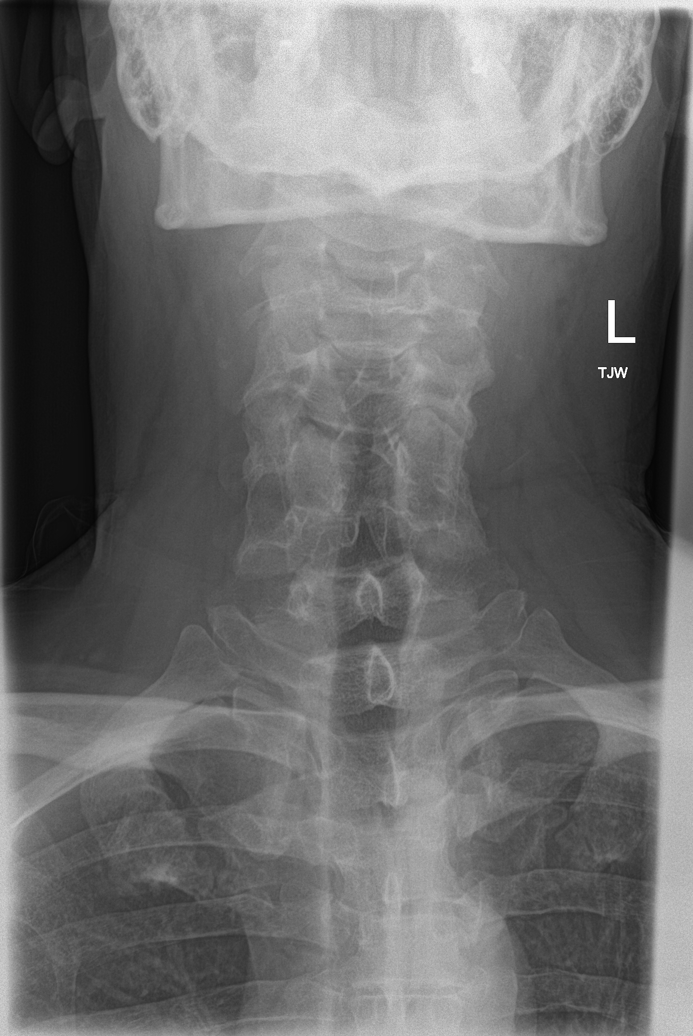

[c-spine open mouth]
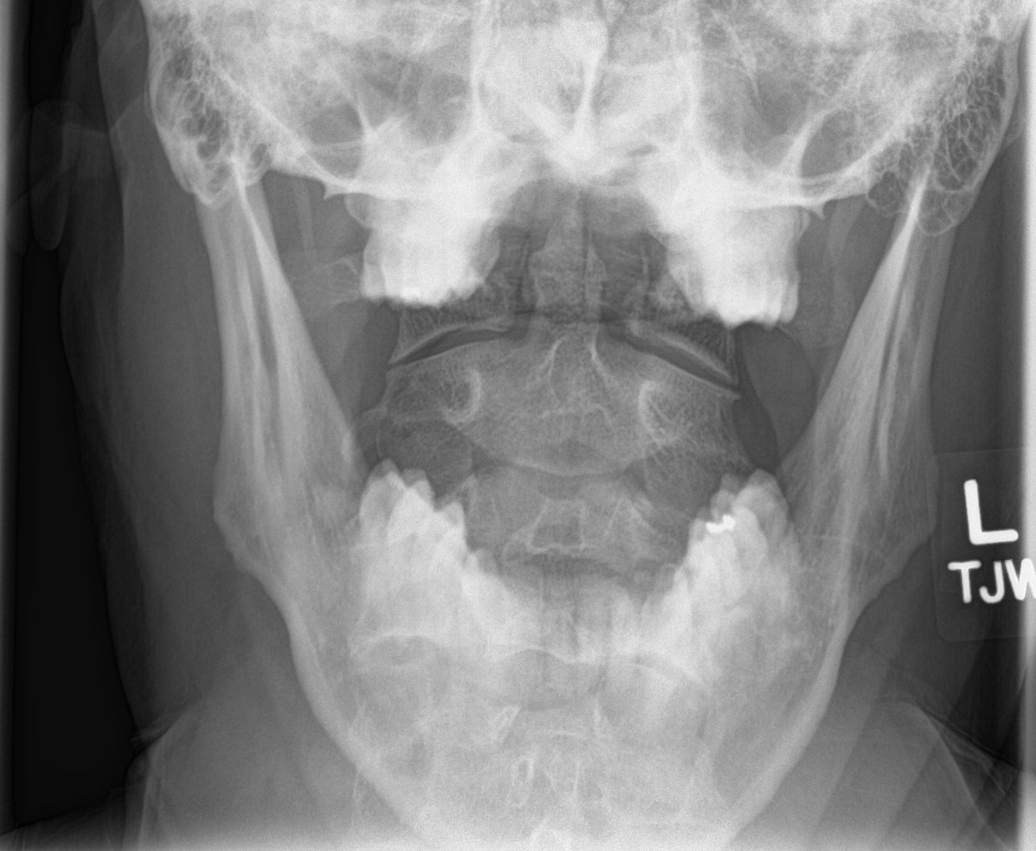

[c-spine swimmers]
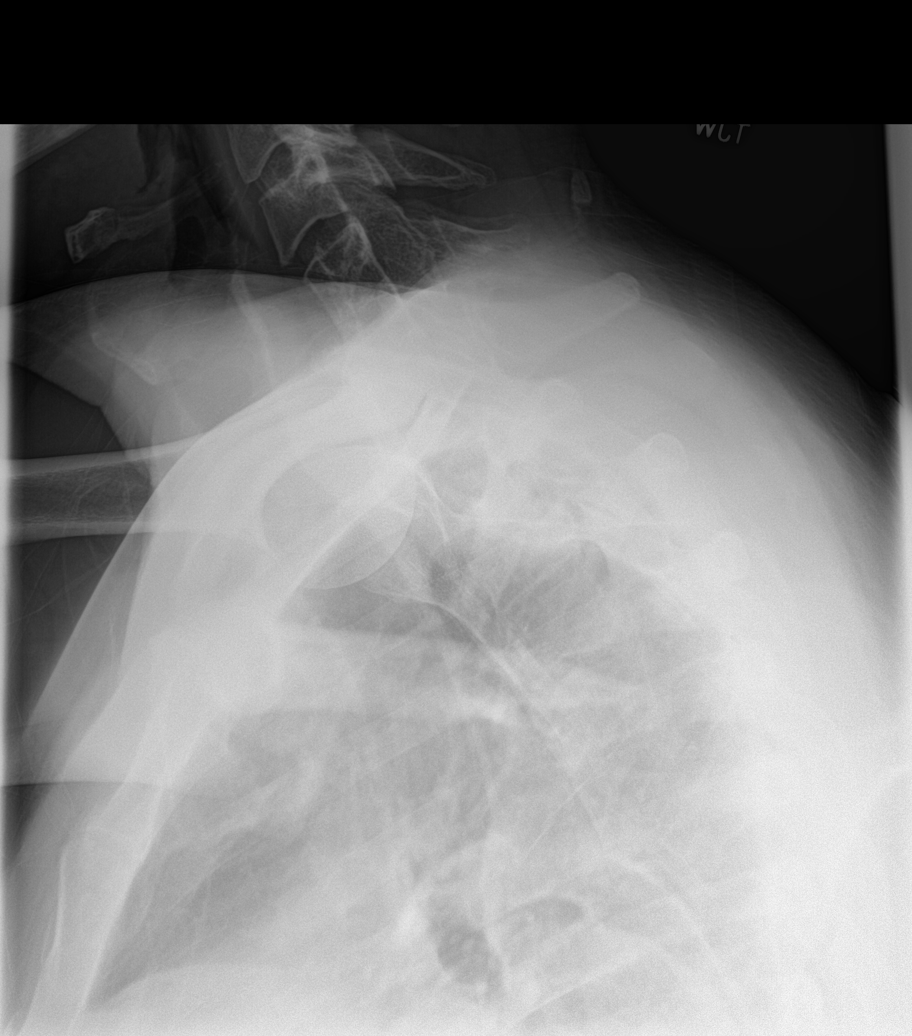

[6 of 6 positions shown; findings below may reference images not displayed]

FINDINGS: There is no prevertebral soft tissue swelling. Vertebral body
heights and anteroposterior alignment are maintained. There is
fusion across the C5-C6 disc space and facets. Multilevel disc space
narrowing is present, greatest at C6-C7. Facet and uncovertebral
hypertrophy contribute to multilevel neural foraminal stenosis,
right greater than left.
IMPRESSION: Multilevel cervical spondylosis as described.

## 2021-08-10 HISTORY — PX: BACK SURGERY: SHX140

## 2021-10-13 ENCOUNTER — Other Ambulatory Visit (INDEPENDENT_AMBULATORY_CARE_PROVIDER_SITE_OTHER): Payer: BC Managed Care – PPO

## 2021-10-13 ENCOUNTER — Other Ambulatory Visit: Payer: Self-pay

## 2021-10-13 DIAGNOSIS — E785 Hyperlipidemia, unspecified: Secondary | ICD-10-CM | POA: Diagnosis not present

## 2021-10-13 LAB — LIPID PANEL
Cholesterol: 195 mg/dL (ref 0–200)
HDL: 38.1 mg/dL — ABNORMAL LOW (ref >39.00)
LDL Cholesterol: 132 mg/dL — ABNORMAL HIGH (ref 0–99)
NonHDL: 156.85
Total CHOL/HDL Ratio: 5
Triglycerides: 125 mg/dL (ref 0.0–149.0)
VLDL: 25 mg/dL (ref 0.0–40.0)

## 2021-10-31 ENCOUNTER — Other Ambulatory Visit: Payer: Self-pay | Admitting: Family Medicine

## 2022-04-27 DIAGNOSIS — M5412 Radiculopathy, cervical region: Secondary | ICD-10-CM | POA: Diagnosis not present

## 2022-05-03 ENCOUNTER — Other Ambulatory Visit: Payer: Self-pay | Admitting: Family Medicine

## 2022-05-03 DIAGNOSIS — E785 Hyperlipidemia, unspecified: Secondary | ICD-10-CM

## 2022-05-04 ENCOUNTER — Other Ambulatory Visit (INDEPENDENT_AMBULATORY_CARE_PROVIDER_SITE_OTHER): Payer: BC Managed Care – PPO

## 2022-05-04 ENCOUNTER — Ambulatory Visit: Payer: BC Managed Care – PPO | Admitting: Nurse Practitioner

## 2022-05-04 VITALS — BP 168/100 | HR 89 | Temp 97.4°F | Resp 16 | Ht 72.0 in | Wt 262.1 lb

## 2022-05-04 DIAGNOSIS — M792 Neuralgia and neuritis, unspecified: Secondary | ICD-10-CM | POA: Insufficient documentation

## 2022-05-04 DIAGNOSIS — E785 Hyperlipidemia, unspecified: Secondary | ICD-10-CM

## 2022-05-04 DIAGNOSIS — I10 Essential (primary) hypertension: Secondary | ICD-10-CM | POA: Diagnosis not present

## 2022-05-04 LAB — LIPID PANEL
Cholesterol: 212 mg/dL — ABNORMAL HIGH (ref 0–200)
HDL: 41.6 mg/dL (ref >39.00)
LDL Cholesterol: 135 mg/dL — ABNORMAL HIGH (ref 0–99)
NonHDL: 170.71
Total CHOL/HDL Ratio: 5
Triglycerides: 179 mg/dL — ABNORMAL HIGH (ref 0.0–149.0)
VLDL: 35.8 mg/dL (ref 0.0–40.0)

## 2022-05-04 LAB — COMPREHENSIVE METABOLIC PANEL
ALT: 23 U/L (ref 0–53)
AST: 18 U/L (ref 0–37)
Albumin: 4.1 g/dL (ref 3.5–5.2)
Alkaline Phosphatase: 58 U/L (ref 39–117)
BUN: 15 mg/dL (ref 6–23)
CO2: 31 mEq/L (ref 19–32)
Calcium: 8.8 mg/dL (ref 8.4–10.5)
Chloride: 102 mEq/L (ref 96–112)
Creatinine, Ser: 0.89 mg/dL (ref 0.40–1.50)
GFR: 102.08 mL/min (ref >60.00)
Glucose, Bld: 96 mg/dL (ref 70–99)
Potassium: 3.4 mEq/L — ABNORMAL LOW (ref 3.5–5.1)
Sodium: 141 mEq/L (ref 135–145)
Total Bilirubin: 0.5 mg/dL (ref 0.2–1.2)
Total Protein: 6.7 g/dL (ref 6.0–8.3)

## 2022-05-04 MED ORDER — DEXAMETHASONE SODIUM PHOSPHATE 10 MG/ML IJ SOLN
10.0000 mg | Freq: Once | INTRAMUSCULAR | Status: AC
  Administered 2022-05-04: 10 mg via INTRAMUSCULAR

## 2022-05-04 MED ORDER — KETOROLAC TROMETHAMINE 60 MG/2ML IM SOLN
60.0000 mg | Freq: Once | INTRAMUSCULAR | Status: AC
  Administered 2022-05-04: 60 mg via INTRAMUSCULAR

## 2022-05-04 NOTE — Progress Notes (Signed)
Acute Office Visit  Subjective:     Patient ID: Kent Landry, male    DOB: [birth year], 46 y.o.   MRN: [id number]  Chief Complaint  Patient presents with   Neck Pain    Started about 2 weeks ago woke up with a "crick in his neck", felt like a pulled muscle. Last week at the beach had more pain in the neck right side, right arm pain and decreased strength. Went to Urgent care and was given Ibuprofen 800 mg and Flexeril, symptoms not better. No specific trauma or injury     Patient is in today for neck pain   States that he had a crick in his neck approx 2 weeks ago. States that it evolved and moved into the right shoulder/arm. States he went to the beach last week. States that he had to load the stuff, made it on Saturday and then Sunday it started really bothering him. States that he went to urgent care while on vacation.  The urgent care visit happened on 04/27/2022.  Note in EMR to review has been taking flexeril and ibuprofen, with minimal to no relief.  Weakness with little grip strength. Has a history of neck radicular pain on the left side a few years ago.  They did do a cervical x-ray and sent him to physical therapy which resolved the issue.  States this feels different per his report.  States no medication taken today thus far.  States he was in earlier this morning for physical labs for his primary care provider.  Review of Systems  Constitutional:  Negative for chills and fever.  Cardiovascular:  Negative for chest pain.  Musculoskeletal:  Positive for neck pain.  Neurological:  Positive for tingling and weakness.        Objective:    BP (!) 168/100   Pulse 89   Temp (!) 97.4 F (36.3 C) (Temporal)   Resp 16   Ht 6' (1.829 m)   Wt 262 lb 2 oz (118.9 kg)   SpO2 97%   BMI 35.55 kg/m    Physical Exam Vitals and nursing note reviewed.  Cardiovascular:     Rate and Rhythm: Normal rate and regular rhythm.     Heart sounds: Normal heart sounds.  Pulmonary:      Effort: Pulmonary effort is normal.     Breath sounds: Normal breath sounds.  Musculoskeletal:        General: No tenderness or signs of injury.       Arms:     Comments: Paraspaculiar region.  With palpation was able to elicit similar sensation  Neurological:     General: No focal deficit present.     Mental Status: He is alert.     Deep Tendon Reflexes:     Reflex Scores:      Bicep reflexes are 2+ on the right side and 2+ on the left side.    Comments: 2 + bilateral radial pulses  Bilateral upper extremity strength 5/5     No results found for any visits on 05/04/22.      Assessment & Plan:   Problem List Items Addressed This Visit       Cardiovascular and Mediastinum   Essential hypertension, benign    Patient blood pressure elevated today and blood pressure elevated we went to the urgent care at the [facility].  Likely secondary to discomfort and pain.  Patient does have ability to check blood pressure at home encouraged to do  such.        Other   Radicular pain in right arm - Primary    Radicular in nature.  No discrete injury defer imaging today.  Patient cannot tolerate prednisone Dosepaks.  We did have discussion we will administer dexamethasone 10 mg IM x1 dose.  Also administer Toradol 60 mg IM.  Did discuss to avoid NSAIDs for the next 12 hours.  Patient acknowledged.  He continues on Flexeril as prescribed.  If no great benefit we can consider using gabapentin to help with rest and nerve pain along with imaging if warranted.      Relevant Medications   ketorolac (TORADOL) injection 60 mg   dexamethasone (DECADRON) injection 10 mg    Meds ordered this encounter  Medications   ketorolac (TORADOL) injection 60 mg   dexamethasone (DECADRON) injection 10 mg    Return if symptoms worsen or fail to improve.  Romilda Garret, NP

## 2022-05-04 NOTE — Patient Instructions (Addendum)
Nice to see you today Avoid NSAIDs for 12 hours this includes ibuprofen, aleve, motrin, naproxen, BC/Goody powders.  You can continue taking the flexeril. You can try rub on lidocaine and/or Voltaren gel. If it does not improve with these injections let me know

## 2022-05-04 NOTE — Assessment & Plan Note (Signed)
Patient blood pressure elevated today and blood pressure elevated we went to the urgent care at the [facility].  Likely secondary to discomfort and pain.  Patient does have ability to check blood pressure at home encouraged to do such.

## 2022-05-04 NOTE — Assessment & Plan Note (Signed)
Radicular in nature.  No discrete injury defer imaging today.  Patient cannot tolerate prednisone Dosepaks.  We did have discussion we will administer dexamethasone 10 mg IM x1 dose.  Also administer Toradol 60 mg IM.  Did discuss to avoid NSAIDs for the next 12 hours.  Patient acknowledged.  He continues on Flexeril as prescribed.  If no great benefit we can consider using gabapentin to help with rest and nerve pain along with imaging if warranted.

## 2022-05-06 ENCOUNTER — Other Ambulatory Visit: Payer: Self-pay | Admitting: Family Medicine

## 2022-05-06 ENCOUNTER — Ambulatory Visit (INDEPENDENT_AMBULATORY_CARE_PROVIDER_SITE_OTHER)
Admission: RE | Admit: 2022-05-06 | Discharge: 2022-05-06 | Disposition: A | Discharge status: Home / Self Care | Payer: BC Managed Care – PPO | Source: Ambulatory Visit | Attending: Family Medicine | Admitting: Family Medicine

## 2022-05-06 ENCOUNTER — Ambulatory Visit: Payer: BC Managed Care – PPO | Admitting: Family Medicine

## 2022-05-06 ENCOUNTER — Encounter: Payer: Self-pay | Admitting: Family Medicine

## 2022-05-06 VITALS — BP 144/80 | HR 81 | Temp 98.1°F | Ht 72.0 in | Wt 261.2 lb

## 2022-05-06 DIAGNOSIS — M542 Cervicalgia: Secondary | ICD-10-CM

## 2022-05-06 DIAGNOSIS — M47812 Spondylosis without myelopathy or radiculopathy, cervical region: Secondary | ICD-10-CM | POA: Diagnosis not present

## 2022-05-06 DIAGNOSIS — M5010 Cervical disc disorder with radiculopathy, unspecified cervical region: Secondary | ICD-10-CM

## 2022-05-06 MED ORDER — AMITRIPTYLINE HCL 25 MG PO TABS: 25.0000 mg | ORAL_TABLET | Freq: Every day | ORAL | 1 refills | Status: DC

## 2022-05-06 MED ORDER — METHYLPREDNISOLONE 4 MG PO TBPK: ORAL_TABLET | ORAL | 0 refills | Status: DC

## 2022-05-06 NOTE — Progress Notes (Signed)
Kent Kelley T. Carman Essick, MD, [place] at [facility] [place], [address]  Phone: [phone]  FAX: [phone]  Kent Landry - 46 y.o. male  MRN [id number]  Date of Birth: 07-11-1976  Date: 05/06/2022  PCP: Tonia Ghent, MD  Referral: Tonia Ghent, MD  Chief Complaint  Patient presents with   Neck Pain    Seen Romilda Garret on 05/04/22   Arm Pain        Subjective:   Kent Landry is a 46 y.o. very pleasant male patient with Body mass index is 35.43 kg/m. who presents with the following:  Presents with acute neck pain:  Saw Matt Cable 2 days ago and was given Toradol 60 mg and Decadron 10 mg.   2 weeks ago, now with diffuse R radicular pain in the R arm.  Dexterity is down, hand strength is off.  He also has had some decrease sensation of the dorsum of the arm on the right side.  He is also having significant posterior neck pain and decreased range of motion in all directions.  He does have a history of having some cervical radiculopathy in the past, several years ago did do some formal physical therapy and had a complete relief of symptoms.  He has been trying to do some basic motion at home.  Has been up since 3 AM.   He is not sleeping despite taking Flexeril 10 mg at nighttime.  Dorsum of forearm with decreased sensation  Review of Systems is noted in the HPI, as appropriate  Objective:   BP (!) 144/80   Pulse 81   Temp 98.1 F (36.7 C) (Oral)   Ht 6' (1.829 m)   Wt 261 lb 4 oz (118.5 kg)   SpO2 98%   BMI 35.43 kg/m   GEN: No acute distress; alert,appropriate. PULM: Breathing comfortably in no respiratory distress PSYCH: Normally interactive.   CERVICAL SPINE EXAM Range of motion: Flexion, extension, lateral bending, and rotation: Roughly 35 to 40% loss of motion in all directions Pain with terminal motion: Yes Spinous Processes: NT SCM: NT Upper paracervical muscles: Posterior cervical  motion tenderness, muscle Upper traps: NT C5-T1 intact, sensation and motor -with the exception of the dorsum of the forearm on the right side  Laboratory and Imaging Data:  Assessment and Plan:     ICD-10-CM   1. Cervical disc disorder with radiculopathy of cervical region  M50.10 DG Cervical Spine Complete    2. Cervicalgia  M54.2      Right-sided cervical radiculopathy, acute on chronic with exacerbation.  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  We will update these today.  Also reviewed with him [facility] protocol and gave him a handout for the [employer].  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    Medication Management during today's office visit: Meds ordered this encounter  Medications   methylPREDNISolone (MEDROL DOSEPAK) 4 MG TBPK tablet    Sig: Follow directions on Dose Pak: Day 1: 2 tablets before breakfast, 1 tablet after lunch and after supper, 2 tablets at bedtime Day 2: 1 tablet before breakfast, 1 tablet after lunch and after supper, 2 tablets at bedtime Day 3: 1 tablet before breakfast and 1 tablet after lunch, after supper, and at bedtime Day 4: 1  tablet before breakfast, after lunch, and at bedtime Day 5: 1 tablet before breakfast and at bedtime Day 6: 1 tablet before breakfast.    Dispense:  21 tablet    Refill:  0    Dispense 1 Methylprednisolone Dose Pak   amitriptyline (ELAVIL) 25 MG tablet    Sig: Take 1 tablet (25 mg total) by mouth at bedtime.    Dispense:  30 tablet    Refill:  1   There are no discontinued medications.  Orders placed today for conditions managed today: Orders Placed This Encounter  Procedures   DG Cervical Spine Complete    Disposition: Return in about 3 weeks (around 05/27/2022).  Dragon [employer] One speech-to-text software was used for transcription in this dictation.   Possible transcriptional errors can occur using Editor, commissioning.   Signed,  Maud Deed. Ogden Handlin, MD   Outpatient Encounter Medications as of 05/06/2022  Medication Sig   amitriptyline (ELAVIL) 25 MG tablet Take 1 tablet (25 mg total) by mouth at bedtime.   buPROPion ER (WELLBUTRIN SR) 100 MG 12 hr tablet TAKE 1 TABLET BY MOUTH EVERY DAY   cyclobenzaprine (FLEXERIL) 10 MG tablet Take by mouth.   fexofenadine (ALLEGRA) 60 MG tablet Take 60 mg by mouth daily. OTC medication   fluticasone (FLONASE) 50 MCG/ACT nasal spray USE 2 SPRAYS IN EACH NOSTRIL DAILY   hydrochlorothiazide (HYDRODIURIL) 25 MG tablet TAKE 1 TABLET (25 MG TOTAL) BY MOUTH DAILY.   ibuprofen (ADVIL) 800 MG tablet Take 1 tablet by mouth 4 (four) times daily.   lisinopril (ZESTRIL) 40 MG tablet TAKE 1 TABLET BY MOUTH EVERY DAY   methylPREDNISolone (MEDROL DOSEPAK) 4 MG TBPK tablet Follow directions on Dose Pak: Day 1: 2 tablets before breakfast, 1 tablet after lunch and after supper, 2 tablets at bedtime Day 2: 1 tablet before breakfast, 1 tablet after lunch and after supper, 2 tablets at bedtime Day 3: 1 tablet before breakfast and 1 tablet after lunch, after supper, and at bedtime Day 4: 1 tablet before breakfast, after lunch, and at bedtime Day 5: 1 tablet before breakfast and at bedtime Day 6: 1 tablet before breakfast.   Multiple Vitamin (MULTIVITAMIN) tablet Take 1 tablet by mouth daily.     [DISCONTINUED] buPROPion ER (WELLBUTRIN SR) 100 MG 12 hr tablet TAKE 1 TABLET BY MOUTH EVERY DAY   [DISCONTINUED] hydrochlorothiazide (HYDRODIURIL) 25 MG tablet Take 1 tablet (25 mg total) by mouth daily.   [DISCONTINUED] lisinopril (ZESTRIL) 40 MG tablet Take 1 tablet (40 mg total) by mouth daily.   No facility-administered encounter medications on file as of 05/06/2022.

## 2022-05-11 ENCOUNTER — Ambulatory Visit (INDEPENDENT_AMBULATORY_CARE_PROVIDER_SITE_OTHER): Payer: BC Managed Care – PPO | Admitting: Family Medicine

## 2022-05-11 ENCOUNTER — Encounter: Payer: Self-pay | Admitting: Family Medicine

## 2022-05-11 VITALS — BP 142/96 | HR 86 | Temp 98.0°F | Ht 72.0 in | Wt 257.0 lb

## 2022-05-11 DIAGNOSIS — Z0000 Encounter for general adult medical examination without abnormal findings: Secondary | ICD-10-CM

## 2022-05-11 DIAGNOSIS — Z23 Encounter for immunization: Secondary | ICD-10-CM

## 2022-05-11 DIAGNOSIS — F17200 Nicotine dependence, unspecified, uncomplicated: Secondary | ICD-10-CM

## 2022-05-11 DIAGNOSIS — Z1211 Encounter for screening for malignant neoplasm of colon: Secondary | ICD-10-CM

## 2022-05-11 DIAGNOSIS — I10 Essential (primary) hypertension: Secondary | ICD-10-CM

## 2022-05-11 DIAGNOSIS — M792 Neuralgia and neuritis, unspecified: Secondary | ICD-10-CM

## 2022-05-11 DIAGNOSIS — R29898 Other symptoms and signs involving the musculoskeletal system: Secondary | ICD-10-CM

## 2022-05-11 DIAGNOSIS — M542 Cervicalgia: Secondary | ICD-10-CM

## 2022-05-11 DIAGNOSIS — Z7189 Other specified counseling: Secondary | ICD-10-CM

## 2022-05-11 MED ORDER — GABAPENTIN 300 MG PO CAPS: 300.0000 mg | ORAL_CAPSULE | Freq: Two times a day (BID) | ORAL | 1 refills | Status: DC | PRN

## 2022-05-11 MED ORDER — LISINOPRIL 40 MG PO TABS: 40.0000 mg | ORAL_TABLET | Freq: Every day | ORAL | 3 refills | Status: DC

## 2022-05-11 MED ORDER — AMITRIPTYLINE HCL 25 MG PO TABS: 50.0000 mg | ORAL_TABLET | Freq: Every day | ORAL | Status: DC

## 2022-05-11 MED ORDER — HYDROCHLOROTHIAZIDE 25 MG PO TABS: 25.0000 mg | ORAL_TABLET | Freq: Every day | ORAL | 3 refills | Status: DC

## 2022-05-11 NOTE — Progress Notes (Unsigned)
CPE- See plan.  Routine anticipatory guidance given to patient.  See health maintenance.  The possibility exists that previously documented standard health maintenance information may have been brought forward from a previous encounter into this note.  If needed, that same information has been updated to reflect the current situation based on today's encounter.    Tetanus 2022  Flu today.   PNA and shingles shot not due.  covid vaccine done 2021.   Prostate cancer screening not due.  D/w patient [id number] for colon cancer screening, including IFOB vs. colonoscopy.  Risks and benefits of both were discussed and patient voiced understanding.  Pt elects [id number]  Diet and exercise d/w pt.   Living will d/w pt. Wife would be designated if patient were incapacitated.   Hypertension:    Using medication without problems or lightheadedness: yes Chest pain with exertion:no Edema:no Short of breath:no  Using nicotine gum and not smoking or dipping.  Wellbutrin helped with smoking cessation.  Posterior arm and 4th and 5th fingers affected with altered sensation.  His grip strength is worse in the meantime, asymmetric.  Sx started 04/23/22.    He is in the midst of steroid taper but still has ongoing sx.  His handwriting is affected.   Recent imaging with IMPRESSION: No recent fracture is seen. Cervical spondylosis with interval progression. There is encroachment of neural foramina by bony spurs at C3-C4, C4-C5 and C6-C7 levels.   PMH and SH reviewed  Meds, vitals, and allergies reviewed.   ROS: Per HPI.  Unless specifically indicated otherwise in HPI, the patient denies:  General: fever. Eyes: acute vision changes ENT: sore throat Cardiovascular: chest pain Respiratory: SOB GI: vomiting GU: dysuria Musculoskeletal: acute back pain Derm: acute rash Neuro: acute motor dysfunction Psych: worsening mood Endocrine: polydipsia Heme: bleeding Allergy: hayfever  GEN: nad, alert  and oriented HEENT: ncat NECK: supple w/o LA CV: rrr. PULM: ctab, no inc wob ABD: soft, +bs EXT: no edema SKIN: no acute rash R 4th and 5th finger weaker with altered sensation.

## 2022-05-11 NOTE — Patient Instructions (Signed)
Start gabapentin, sedation caution.  Finish the steroid taper.   We'll call about the MRI and neurosurgery.  If you don't get scheduled soon, then let me know.   We'll call about seeing GI.  Take care.  Glad to see you.

## 2022-05-12 ENCOUNTER — Telehealth: Payer: Self-pay | Admitting: Family Medicine

## 2022-05-12 NOTE — Telephone Encounter (Signed)
Patient came by and dropped off short term disability paperwork to be filled out by provider. Would like a phone call when completed.

## 2022-05-12 NOTE — Telephone Encounter (Signed)
Form placed in Dr. Duncans box 

## 2022-05-13 ENCOUNTER — Encounter: Payer: Self-pay | Admitting: Family Medicine

## 2022-05-13 ENCOUNTER — Telehealth: Payer: Self-pay | Admitting: Family Medicine

## 2022-05-13 NOTE — Assessment & Plan Note (Signed)
Discussed options. Posterior arm and 4th and 5th fingers affected with altered sensation.  His grip strength is worse in the meantime, asymmetric.  Sx started 04/23/22.  He is in the midst of steroid taper but still has ongoing sx.  His handwriting is affected.   Recent imaging with IMPRESSION: No recent fracture is seen. Cervical spondylosis with interval progression. There is encroachment of neural foramina by bony spurs at C3-C4, C4-C5 and C6-C7 levels.  Discussed options Start gabapentin, sedation caution.  Finish the steroid taper.   If worsening weakness then to ER.

## 2022-05-13 NOTE — Assessment & Plan Note (Signed)
Continue Wellbutrin.  No ADE on med.

## 2022-05-13 NOTE — Telephone Encounter (Signed)
See my chart message below.  He already has neurosurgery appointment scheduled.  He needs his MRI done in the meantime.  Please let me know if it needs to be reordered as a stat.  ===================== Hi Doc. Just wanted to check in with you concerning MRI. I have an appointment with the neurosurgeon October 19. The told me I would need an MRI before this date.  Thanks and have a great day [employer]

## 2022-05-13 NOTE — Assessment & Plan Note (Signed)
Tetanus 2022  Flu today.   PNA and shingles shot not due.  covid vaccine done 2021.   Prostate cancer screening not due.  D/w patient RK:[id number] for colon cancer screening, including IFOB vs. colonoscopy.  Risks and benefits of both were discussed and patient voiced understanding.  Pt elects SEG:[id number]   Diet and exercise d/w pt.   Living will d/w pt. Wife would be designated if patient were incapacitated.

## 2022-05-13 NOTE — Assessment & Plan Note (Signed)
Continue lisinopril and hydrochlorothiazide.  Labs discussed with patient.

## 2022-05-14 NOTE — Telephone Encounter (Signed)
Dr Damita Dunnings completed form.  Form states not to return to [place] [employer], patient knows he must turn in paperwork.  Copies have been made for scanning, patient, and myself.  Patient's copy has been placed in front office for pickup.  I called patient to notify him his copy is ready to be picked up.

## 2022-05-14 NOTE — Telephone Encounter (Signed)
Patient is scheduled for MRI on 05/18/22

## 2022-05-18 ENCOUNTER — Encounter: Payer: Self-pay | Admitting: Family Medicine

## 2022-05-18 ENCOUNTER — Ambulatory Visit
Admission: RE | Admit: 2022-05-18 | Discharge: 2022-05-18 | Disposition: A | Discharge status: Home / Self Care | Payer: BC Managed Care – PPO | Source: Ambulatory Visit | Attending: Family Medicine | Admitting: Family Medicine

## 2022-05-18 DIAGNOSIS — R200 Anesthesia of skin: Secondary | ICD-10-CM | POA: Diagnosis not present

## 2022-05-18 DIAGNOSIS — M4802 Spinal stenosis, cervical region: Secondary | ICD-10-CM | POA: Diagnosis not present

## 2022-05-18 DIAGNOSIS — M542 Cervicalgia: Secondary | ICD-10-CM

## 2022-05-18 DIAGNOSIS — R29898 Other symptoms and signs involving the musculoskeletal system: Secondary | ICD-10-CM

## 2022-05-18 DIAGNOSIS — R202 Paresthesia of skin: Secondary | ICD-10-CM | POA: Diagnosis not present

## 2022-05-20 ENCOUNTER — Telehealth: Payer: Self-pay | Admitting: Family Medicine

## 2022-05-20 NOTE — Telephone Encounter (Signed)
Please send a letter to patient to hold him out through the end of this week and potentially through his appointment on the 19th with neurosurgery.  Thanks.

## 2022-05-20 NOTE — Telephone Encounter (Signed)
Patient called in and stated that he is still having pain and have other appointments this week regarding pain. He was wanting to know if Dr. Damita Dunnings could extend his return to work date until the end of this week. He stated that the letter he currently have is running out. Please advise. Thank you!

## 2022-05-20 NOTE — Telephone Encounter (Signed)
Patient has also sent a mychart message about this on 05/18/22. Are you okay extending his return to work note?

## 2022-05-21 DIAGNOSIS — M5412 Radiculopathy, cervical region: Secondary | ICD-10-CM | POA: Diagnosis not present

## 2022-05-21 NOTE — Telephone Encounter (Signed)
Spoke with patient and he needs note out til the 16th now. He was able to move neurosurgery appt to today. Letter has been done and sent to patients mychart.

## 2022-05-22 ENCOUNTER — Other Ambulatory Visit: Payer: Self-pay | Admitting: Family Medicine

## 2022-05-22 ENCOUNTER — Encounter: Payer: Self-pay | Admitting: Family Medicine

## 2022-05-22 NOTE — Telephone Encounter (Signed)
Pt stated that he is out of the meds, amitriptyline (ELAVIL) 25 MG tablet that Dr. Lorelei Pont perscribed for his neck pain on 05/06/22. Pt stated he was instructed by [facility] that if 1 tablet didn't help, he could take 2 tablets. Pt stated the pharmacy said the next refill isn't until 2 more weeks. Pt is asking what can be done so he can continue meds? Call back # [phone]

## 2022-05-24 NOTE — Telephone Encounter (Signed)
Okay to send in Rx with 2 tablets daily dosing?

## 2022-05-25 MED ORDER — AMITRIPTYLINE HCL 25 MG PO TABS: 50.0000 mg | ORAL_TABLET | Freq: Every day | ORAL | 3 refills | Status: DC

## 2022-05-25 NOTE — Telephone Encounter (Signed)
Left message for Kent Landry that Dr. Lorelei Pont has sent in a new Rx with new dosing instructions of taking 2 tablets so insurance should go ahead a pay for prescription.  I ask that he call me back if he has any trouble picking up Rx.

## 2022-06-02 DIAGNOSIS — M5412 Radiculopathy, cervical region: Secondary | ICD-10-CM | POA: Diagnosis not present

## 2022-06-04 ENCOUNTER — Other Ambulatory Visit: Payer: Self-pay | Admitting: Neurosurgery

## 2022-06-04 DIAGNOSIS — M5412 Radiculopathy, cervical region: Secondary | ICD-10-CM

## 2022-06-13 ENCOUNTER — Ambulatory Visit
Admission: RE | Admit: 2022-06-13 | Discharge: 2022-06-13 | Disposition: A | Discharge status: Home / Self Care | Payer: BC Managed Care – PPO | Source: Ambulatory Visit | Attending: Neurosurgery | Admitting: Neurosurgery

## 2022-06-13 DIAGNOSIS — M4802 Spinal stenosis, cervical region: Secondary | ICD-10-CM | POA: Diagnosis not present

## 2022-06-13 DIAGNOSIS — M5412 Radiculopathy, cervical region: Secondary | ICD-10-CM

## 2022-06-16 ENCOUNTER — Other Ambulatory Visit: Payer: Self-pay | Admitting: Family Medicine

## 2022-06-18 DIAGNOSIS — Z6833 Body mass index (BMI) 33.0-33.9, adult: Secondary | ICD-10-CM | POA: Diagnosis not present

## 2022-06-18 DIAGNOSIS — M5412 Radiculopathy, cervical region: Secondary | ICD-10-CM | POA: Diagnosis not present

## 2022-06-23 DIAGNOSIS — M542 Cervicalgia: Secondary | ICD-10-CM | POA: Diagnosis not present

## 2022-06-26 DIAGNOSIS — M542 Cervicalgia: Secondary | ICD-10-CM | POA: Diagnosis not present

## 2022-07-01 DIAGNOSIS — M5013 Cervical disc disorder with radiculopathy, cervicothoracic region: Secondary | ICD-10-CM | POA: Diagnosis not present

## 2022-07-01 DIAGNOSIS — M47812 Spondylosis without myelopathy or radiculopathy, cervical region: Secondary | ICD-10-CM | POA: Diagnosis not present

## 2022-07-13 ENCOUNTER — Encounter: Payer: Self-pay | Admitting: Internal Medicine

## 2022-07-28 DIAGNOSIS — M542 Cervicalgia: Secondary | ICD-10-CM | POA: Diagnosis not present

## 2022-07-31 ENCOUNTER — Other Ambulatory Visit: Payer: Self-pay | Admitting: Family Medicine

## 2022-08-05 DIAGNOSIS — M542 Cervicalgia: Secondary | ICD-10-CM | POA: Diagnosis not present

## 2022-08-31 ENCOUNTER — Ambulatory Visit (AMBULATORY_SURGERY_CENTER): Payer: Self-pay

## 2022-08-31 VITALS — Ht 74.0 in | Wt 265.0 lb

## 2022-08-31 DIAGNOSIS — Z1211 Encounter for screening for malignant neoplasm of colon: Secondary | ICD-10-CM

## 2022-08-31 MED ORDER — NA SULFATE-K SULFATE-MG SULF 17.5-3.13-1.6 GM/177ML PO SOLN: 1.0000 | Freq: Once | ORAL | 0 refills | Status: AC

## 2022-08-31 NOTE — Progress Notes (Signed)
Pre visit completed via phone call; Patient verified name, DOB, and address;  No egg or soy allergy known to patient  No issues known to pt with past sedation with any surgeries or procedures Patient denies ever being told they had issues or difficulty with intubation  No FH of Malignant Hyperthermia Pt is not on diet pills Pt is not on home 02  Pt is not on blood thinners  Pt denies issues with constipation;  No A fib or A flutter Have any cardiac testing pending--NO Pt instructed to use Singlecare.com or GoodRx for a price reduction on prep   Insurance verified during [place] appt=EBMS  Patient's chart reviewed by Osvaldo Angst CNRA prior to previsit and patient appropriate for the [place].  Previsit completed and red dot placed by patient's name on their procedure day (on provider's schedule).

## 2022-09-24 ENCOUNTER — Encounter: Payer: Self-pay | Admitting: Internal Medicine

## 2022-09-30 ENCOUNTER — Encounter: Payer: Self-pay | Admitting: Certified Registered Nurse Anesthetist

## 2022-10-01 ENCOUNTER — Ambulatory Visit: Payer: PRIVATE HEALTH INSURANCE | Admitting: Internal Medicine

## 2022-10-01 ENCOUNTER — Encounter: Payer: Self-pay | Admitting: Internal Medicine

## 2022-10-01 VITALS — BP 138/85 | HR 65 | Temp 97.3°F | Resp 11 | Ht 74.0 in | Wt 265.0 lb

## 2022-10-01 DIAGNOSIS — Z1211 Encounter for screening for malignant neoplasm of colon: Secondary | ICD-10-CM

## 2022-10-01 DIAGNOSIS — D124 Benign neoplasm of descending colon: Secondary | ICD-10-CM

## 2022-10-01 DIAGNOSIS — K635 Polyp of colon: Secondary | ICD-10-CM

## 2022-10-01 MED ORDER — SODIUM CHLORIDE 0.9 % IV SOLN: 500.0000 mL | Freq: Once | INTRAVENOUS | Status: DC

## 2022-10-01 NOTE — Op Note (Signed)
[facility] Patient Name: Knowledge Baquero Procedure Date: 10/01/2022 8:16 AM MRN: [id number] Endoscopist: Gatha Mayer , MD, [id number] Age: 47 Referring MD:  Date of Birth: [birth year] Gender: Male Account #: [id number] Procedure:                Colonoscopy Indications:              Screening for colorectal malignant neoplasm, This                            is the patient's first colonoscopy Medicines:                Monitored Anesthesia Care Procedure:                Pre-Anesthesia Assessment:                           - Prior to the procedure, a History and Physical                            was performed, and patient medications and                            allergies were reviewed. The patient's tolerance of                            previous anesthesia was also reviewed. The risks                            and benefits of the procedure and the sedation                            options and risks were discussed with the patient.                            All questions were answered, and informed consent                            was obtained. Prior Anticoagulants: The patient has                            taken no anticoagulant or antiplatelet agents. ASA                            Grade Assessment: II - A patient with mild systemic                            disease. After reviewing the risks and benefits,                            the patient was deemed in satisfactory condition to                            undergo the procedure.  After obtaining informed consent, the colonoscope                            was passed under direct vision. Throughout the                            procedure, the patient's blood pressure, pulse, and                            oxygen saturations were monitored continuously. The                            Olympus CF-[id number]) Colonoscope was                            introduced through the anus and  advanced to the the                            cecum, identified by appendiceal orifice and                            ileocecal valve. The colonoscopy was performed                            without difficulty. The patient tolerated the                            procedure well. The bowel preparation used was                            SUPREP via split dose instruction. The ileocecal                            valve, appendiceal orifice, and rectum were                            photographed. The quality of the bowel preparation                            was good. Scope In: 8:35:33 AM Scope Out: 8:52:03 AM Scope Withdrawal Time: 0 hours 14 minutes 7 seconds  Total Procedure Duration: 0 hours 16 minutes 30 seconds  Findings:                 The perianal and digital rectal examinations were                            normal. Pertinent negatives include normal prostate                            (size, shape, and consistency).                           A 2 mm polyp was found in the descending colon. The  polyp was sessile. The polyp was removed with a                            cold snare. Resection and retrieval were complete.                            Verification of patient identification for the                            specimen was done. Estimated blood loss was minimal.                           Multiple diverticula were found in the sigmoid                            colon.                           The exam was otherwise without abnormality on                            direct and retroflexion views. Complications:            No immediate complications. Estimated Blood Loss:     Estimated blood loss was minimal. Impression:               - One 2 mm polyp in the descending colon, removed                            with a cold snare. Resected and retrieved.                           - Diverticulosis in the sigmoid colon.                           -  The examination was otherwise normal on direct                            and retroflexion views. Recommendation:           - Patient has a contact number available for                            emergencies. The signs and symptoms of potential                            delayed complications were discussed with the                            patient. Return to normal activities tomorrow.                            Written discharge instructions were provided to the                            patient.                           -  Resume previous diet.                           - Continue present medications.                           - Await pathology results.                           - Repeat colonoscopy is recommended. The                            colonoscopy date will be determined after pathology                            results from today's exam become available for                            review. Gatha Mayer, MD 10/01/2022 8:58:16 AM This report has been signed electronically.

## 2022-10-01 NOTE — Progress Notes (Signed)
[facility] History and Physical   Primary Care Physician:  Tonia Ghent, MD   Reason for Procedure:   CRCA screening  Plan:    colonoscopy     HPI: Kent Landry is a 47 y.o. male here for screening colonoscopy   Past Medical History:  Diagnosis Date   Back pain    hx of ruptured disk, went through PT around 2005; sx in 06/2022   GERD (gastroesophageal reflux disease)    with certain foods   Hypertension    on meds   OSA (obstructive sleep apnea)    hx of, not on CPAP, has a mouthpiece   Rhinitis    better with nasocort use   Tobacco abuse    dip tobacco    Past Surgical History:  Procedure Laterality Date   BACK SURGERY  2023   neck C8-T1   VASECTOMY  07/11/2015    Prior to Admission medications   Medication Sig Start Date End Date Taking? Authorizing Provider  buPROPion ER [facility] SR) 100 MG 12 hr tablet TAKE 1 TABLET BY MOUTH EVERY DAY 07/31/22  Yes Tonia Ghent, MD  fexofenadine (ALLEGRA) 60 MG tablet Take 60 mg by mouth daily. OTC medication   Yes [provider]  fluticasone (FLONASE) 50 MCG/ACT nasal spray USE 2 SPRAYS IN EACH NOSTRIL DAILY 03/06/14  Yes Tonia Ghent, MD  hydrochlorothiazide (HYDRODIURIL) 25 MG tablet Take 1 tablet (25 mg total) by mouth daily. 05/11/22  Yes Tonia Ghent, MD  lisinopril (ZESTRIL) 40 MG tablet Take 1 tablet (40 mg total) by mouth daily. 05/11/22  Yes Tonia Ghent, MD    Current Outpatient Medications  Medication Sig Dispense Refill   buPROPion ER (WELLBUTRIN SR) 100 MG 12 hr tablet TAKE 1 TABLET BY MOUTH EVERY DAY 90 tablet 2   fexofenadine (ALLEGRA) 60 MG tablet Take 60 mg by mouth daily. OTC medication     fluticasone (FLONASE) 50 MCG/ACT nasal spray USE 2 SPRAYS IN EACH NOSTRIL DAILY 16 g 12   hydrochlorothiazide (HYDRODIURIL) 25 MG tablet Take 1 tablet (25 mg total) by mouth daily. 90 tablet 3   lisinopril (ZESTRIL) 40 MG tablet Take 1 tablet (40 mg total) by mouth daily. 90  tablet 3   Current Facility-Administered Medications  Medication Dose Route Frequency Provider Last Rate Last Admin   0.9 %  sodium chloride infusion  500 mL Intravenous Once Gatha Mayer, MD        Allergies as of 10/01/2022 - Review Complete 10/01/2022  Allergen Reaction Noted   Bee venom  12/11/2011   Prednisone  05/07/2020    Family History  Problem Relation Age of Onset   Hypertension Father    Stroke Father    Colon polyps Sister 11   Hypertension Sister    Kidney failure Paternal Grandfather    Colon cancer Neg Hx    Prostate cancer Neg Hx    Esophageal cancer Neg Hx    Rectal cancer Neg Hx    Stomach cancer Neg Hx     Social History   Socioeconomic History   Marital status: Married    Spouse name: Not on file   Number of children: 0   Years of education: Not on file   Highest education level: Not on file  Occupational History   Occupation: [employer]    Comment: since 2000  Tobacco Use   Smoking status: Former    Packs/day: 1.00    Types: Cigarettes  Smokeless tobacco: Former  Scientific laboratory technician Use: Never used  Substance and Sexual Activity   Alcohol use: Yes    Alcohol/week: 0.0 - 4.0 standard drinks of alcohol    Comment: Occasional beer   Drug use: No   Sexual activity: Not on file  Other Topics Concern   Not on file  Social History Narrative   From [facility].   Walking some for exercise   Working for [employer] in [place].  Mostly local work.     Married 1999, no kids.   Social Determinants of Health   Financial Resource Strain: Not on file  Food Insecurity: Not on file  Transportation Needs: Not on file  Physical Activity: Not on file  Stress: Not on file  Social Connections: Not on file  Intimate Partner Violence: Not on file    Review of Systems:  All other review of systems negative except as mentioned in the HPI.  Physical Exam: Vital signs BP (!) 177/106   Pulse 72   Temp (!) 97.3 F (36.3 C)  (Temporal)   Resp 18   Ht 6' 2"$  (1.88 m)   Wt 265 lb (120.2 kg)   SpO2 99%   BMI 34.02 kg/m   General:   Alert,  Well-developed, well-nourished, pleasant and cooperative in NAD Lungs:  Clear throughout to auscultation.   Heart:  Regular rate and rhythm; no murmurs, clicks, rubs,  or gallops. Abdomen:  Soft, nontender and nondistended. Normal bowel sounds.   Neuro/Psych:  Alert and cooperative. Normal mood and affect. A and O x 3   @Tache Bobst$  Simonne Maffucci, MD, [facility] [phone] (pager) 10/01/2022 8:28 AM@

## 2022-10-01 NOTE — Patient Instructions (Addendum)
I found and removed one tiny polyp that looks benign. I will let you know pathology results and when to have another routine colonoscopy by mail and/or My Chart.  You also have a condition called diverticulosis - common and not usually a problem. Please read the handout provided.  I appreciate the opportunity to care for you. Gatha Mayer, MD, [facility]  Handouts Provided:  Polyps and Diverticulosis  YOU HAD AN ENDOSCOPIC PROCEDURE TODAY AT [place]:   Refer to the procedure report that was given to you for any specific questions about what was found during the examination.  If the procedure report does not answer your questions, please call your gastroenterologist to clarify.  If you requested that your care partner not be given the details of your procedure findings, then the procedure report has been included in a sealed envelope for you to review at your convenience later.  YOU SHOULD EXPECT: Some feelings of bloating in the abdomen. Passage of more gas than usual.  Walking can help get rid of the air that was put into your GI tract during the procedure and reduce the bloating. If you had a lower endoscopy (such as a colonoscopy or flexible sigmoidoscopy) you may notice spotting of blood in your stool or on the toilet paper. If you underwent a bowel prep for your procedure, you may not have a normal bowel movement for a few days.  Please Note:  You might notice some irritation and congestion in your nose or some drainage.  This is from the oxygen used during your procedure.  There is no need for concern and it should clear up in a day or so.  SYMPTOMS TO REPORT IMMEDIATELY:  Following lower endoscopy (colonoscopy or flexible sigmoidoscopy):  Excessive amounts of blood in the stool  Significant tenderness or worsening of abdominal pains  Swelling of the abdomen that is new, acute  Fever of 100F or higher  For urgent or emergent issues, a gastroenterologist can be reached  at any hour by calling [phone]. Do not use MyChart messaging for urgent concerns.    DIET:  We do recommend a small meal at first, but then you may proceed to your regular diet.  Drink plenty of fluids but you should avoid alcoholic beverages for 24 hours.  ACTIVITY:  You should plan to take it easy for the rest of today and you should NOT DRIVE or use heavy machinery until tomorrow (because of the sedation medicines used during the test).    FOLLOW UP: Our staff will call the number listed on your records the next business day following your procedure.  We will call around 7:15- 8:00 am to check on you and address any questions or concerns that you may have regarding the information given to you following your procedure. If we do not reach you, we will leave a message.     If any biopsies were taken you will be contacted by phone or by letter within the next 1-3 weeks.  Please call us at [phone] if you have not heard about the biopsies in 3 weeks.    SIGNATURES/CONFIDENTIALITY: You and/or your care partner have signed paperwork which will be entered into your electronic medical record.  These signatures attest to the fact that that the information above on your After Visit Summary has been reviewed and is understood.  Full responsibility of the confidentiality of this discharge information lies with you and/or your care-partner.

## 2022-10-01 NOTE — Progress Notes (Signed)
Pt's states no medical or surgical changes since previsit or office visit. 

## 2022-10-01 NOTE — Progress Notes (Signed)
Data will not transfer from monitor, vs being posted manually.   

## 2022-10-01 NOTE — Progress Notes (Signed)
Report given to PACU, vss 

## 2022-10-02 ENCOUNTER — Telehealth: Payer: Self-pay

## 2022-10-02 NOTE — Telephone Encounter (Signed)
Attempted f/u call. No answer, left VM. 

## 2022-10-14 ENCOUNTER — Encounter: Payer: Self-pay | Admitting: Internal Medicine

## 2022-12-07 ENCOUNTER — Other Ambulatory Visit: Payer: Self-pay | Admitting: Family Medicine

## 2022-12-07 DIAGNOSIS — I10 Essential (primary) hypertension: Secondary | ICD-10-CM

## 2022-12-08 ENCOUNTER — Other Ambulatory Visit (INDEPENDENT_AMBULATORY_CARE_PROVIDER_SITE_OTHER): Payer: PRIVATE HEALTH INSURANCE

## 2022-12-08 DIAGNOSIS — I10 Essential (primary) hypertension: Secondary | ICD-10-CM | POA: Diagnosis not present

## 2022-12-08 LAB — COMPREHENSIVE METABOLIC PANEL
ALT: 28 U/L (ref 0–53)
AST: 21 U/L (ref 0–37)
Albumin: 4 g/dL (ref 3.5–5.2)
Alkaline Phosphatase: 57 U/L (ref 39–117)
BUN: 13 mg/dL (ref 6–23)
CO2: 29 mEq/L (ref 19–32)
Calcium: 8.8 mg/dL (ref 8.4–10.5)
Chloride: 103 mEq/L (ref 96–112)
Creatinine, Ser: 0.95 mg/dL (ref 0.40–1.50)
GFR: 94.95 mL/min (ref >60.00)
Glucose, Bld: 98 mg/dL (ref 70–99)
Potassium: 3.3 mEq/L — ABNORMAL LOW (ref 3.5–5.1)
Sodium: 139 mEq/L (ref 135–145)
Total Bilirubin: 0.4 mg/dL (ref 0.2–1.2)
Total Protein: 6.5 g/dL (ref 6.0–8.3)

## 2022-12-08 LAB — LIPID PANEL
Cholesterol: 215 mg/dL — ABNORMAL HIGH (ref 0–200)
HDL: 37 mg/dL — ABNORMAL LOW (ref >39.00)
NonHDL: 178.03
Total CHOL/HDL Ratio: 6
Triglycerides: 207 mg/dL — ABNORMAL HIGH (ref 0.0–149.0)
VLDL: 41.4 mg/dL — ABNORMAL HIGH (ref 0.0–40.0)

## 2022-12-08 LAB — LDL CHOLESTEROL, DIRECT: Direct LDL: 152 mg/dL

## 2022-12-14 ENCOUNTER — Encounter: Payer: Self-pay | Admitting: Family Medicine

## 2022-12-14 ENCOUNTER — Ambulatory Visit (INDEPENDENT_AMBULATORY_CARE_PROVIDER_SITE_OTHER): Payer: PRIVATE HEALTH INSURANCE | Admitting: Family Medicine

## 2022-12-14 VITALS — BP 124/82 | HR 80 | Temp 97.7°F | Ht 74.0 in | Wt 268.0 lb

## 2022-12-14 DIAGNOSIS — Z7189 Other specified counseling: Secondary | ICD-10-CM

## 2022-12-14 DIAGNOSIS — Z0000 Encounter for general adult medical examination without abnormal findings: Secondary | ICD-10-CM | POA: Diagnosis not present

## 2022-12-14 DIAGNOSIS — F17200 Nicotine dependence, unspecified, uncomplicated: Secondary | ICD-10-CM

## 2022-12-14 DIAGNOSIS — I10 Essential (primary) hypertension: Secondary | ICD-10-CM

## 2022-12-14 MED ORDER — LISINOPRIL 40 MG PO TABS: 40.0000 mg | ORAL_TABLET | Freq: Every day | ORAL | 3 refills | Status: DC

## 2022-12-14 MED ORDER — NIRMATRELVIR/RITONAVIR (PAXLOVID)TABLET: 3.0000 | ORAL_TABLET | Freq: Two times a day (BID) | ORAL | 0 refills | Status: AC

## 2022-12-14 MED ORDER — HYDROCHLOROTHIAZIDE 25 MG PO TABS: 25.0000 mg | ORAL_TABLET | Freq: Every day | ORAL | 3 refills | Status: DC

## 2022-12-14 MED ORDER — BUPROPION HCL ER (SR) 100 MG PO TB12: 100.0000 mg | ORAL_TABLET | Freq: Every day | ORAL | 3 refills | Status: DC

## 2022-12-14 NOTE — Assessment & Plan Note (Signed)
  Living will d/w pt.  Wife would be designated if patient were incapacitated.   

## 2022-12-14 NOTE — Assessment & Plan Note (Signed)
D/w pt about smoking cessation likely being the most effective way to lower ASCVD risk at this point.  Could consider statin in the future.  Continue lisinopril and HCTZ for now.  Labs d/w pt.

## 2022-12-14 NOTE — Assessment & Plan Note (Signed)
Tetanus 2022  Flu yearly.   PNA and shingles shot not due.  covid vaccine done 2021.   Prostate cancer screening not due.  Colonoscopy 2024 Diet and exercise d/w pt.   Living will d/w pt. Wife would be designated if patient were incapacitated.

## 2022-12-14 NOTE — Assessment & Plan Note (Signed)
Continue wellbutrin, working on cessation, encouraged patient.  He'll update me as needed.

## 2022-12-14 NOTE — Patient Instructions (Signed)
Take care.  Glad to see you.  Update me as needed.  Thanks for your effort.  

## 2022-12-14 NOTE — Progress Notes (Signed)
CPE- See plan.  Routine anticipatory guidance given to patient.  See health maintenance.  The possibility exists that previously documented standard health maintenance information may have been brought forward from a previous encounter into this note.  If needed, that same information has been updated to reflect the current situation based on today's encounter.    Tetanus 2022  Flu yearly.   PNA and shingles shot not due.  covid vaccine done 2021.   Prostate cancer screening not due.  Colonoscopy 2024 Diet and exercise d/w pt.   Living will d/w pt. Wife would be designated if patient were incapacitated.   Travelling to [place], he was asking about having paxlovid on hand in case of covid infection.  Rx sent.  Routine cautions d/w pt.    Hypertension:    Using medication without problems or lightheadedness: yes Chest pain with exertion:no Edema:no Short of breath:no Labs d/w pt.    Tobacco d/w pt.  He cut back, smoking less.  D/w pt.  Still on wellbutrin.  He got a new job at work.  D/w pt.    Neck and arm pain clearly better.   PMH and SH reviewed  Meds, vitals, and allergies reviewed.   ROS: Per HPI.  Unless specifically indicated otherwise in HPI, the patient denies:  General: fever. Eyes: acute vision changes ENT: sore throat Cardiovascular: chest pain Respiratory: SOB GI: vomiting GU: dysuria Musculoskeletal: acute back pain Derm: acute rash Neuro: acute motor dysfunction Psych: worsening mood Endocrine: polydipsia Heme: bleeding Allergy: hayfever  GEN: nad, alert and oriented HEENT: ncat NECK: supple w/o LA CV: rrr. PULM: ctab, no inc wob ABD: soft, +bs EXT: no edema SKIN: no acute rash  The 10-year ASCVD risk score (Arnett DK, et al., 2019) is: 11.4%   Values used to calculate the score:     Age: 47 years     Sex: Male     Is Non-Hispanic African American: No     Diabetic: No     Tobacco smoker: Yes     Systolic Blood Pressure: 124 mmHg     Is BP  treated: Yes     HDL Cholesterol: 37 mg/dL     Total Cholesterol: 215 mg/dL  D/w pt about smoking cessation likely being the most effective way to lower ASCVD risk at this point.

## 2023-10-28 ENCOUNTER — Other Ambulatory Visit: Payer: Self-pay | Admitting: Family Medicine

## 2024-01-26 ENCOUNTER — Other Ambulatory Visit: Payer: Self-pay | Admitting: Family Medicine

## 2024-02-23 ENCOUNTER — Other Ambulatory Visit: Payer: Self-pay | Admitting: Family Medicine

## 2024-02-23 NOTE — Telephone Encounter (Signed)
 Please call patient to schedule physical. Once scheduled please route back to pool. Thank you

## 2024-02-24 NOTE — Telephone Encounter (Signed)
 Cpe and lab visit scheduled, Patient states he has two pills left and is flying out tomorrow, asking if medication can be called in today?

## 2024-03-06 ENCOUNTER — Other Ambulatory Visit: Payer: Self-pay | Admitting: Family Medicine

## 2024-03-06 DIAGNOSIS — I10 Essential (primary) hypertension: Secondary | ICD-10-CM

## 2024-03-09 ENCOUNTER — Other Ambulatory Visit (INDEPENDENT_AMBULATORY_CARE_PROVIDER_SITE_OTHER): Payer: PRIVATE HEALTH INSURANCE

## 2024-03-09 DIAGNOSIS — I10 Essential (primary) hypertension: Secondary | ICD-10-CM | POA: Diagnosis not present

## 2024-03-09 LAB — COMPREHENSIVE METABOLIC PANEL WITH GFR
ALT: 32 U/L (ref 0–53)
AST: 22 U/L (ref 0–37)
Albumin: 4.2 g/dL (ref 3.5–5.2)
Alkaline Phosphatase: 64 U/L (ref 39–117)
BUN: 11 mg/dL (ref 6–23)
CO2: 32 meq/L (ref 19–32)
Calcium: 9 mg/dL (ref 8.4–10.5)
Chloride: 103 meq/L (ref 96–112)
Creatinine, Ser: 1.01 mg/dL (ref 0.40–1.50)
GFR: 87.45 mL/min (ref >60.00)
Glucose, Bld: 102 mg/dL — ABNORMAL HIGH (ref 70–99)
Potassium: 3.3 meq/L — ABNORMAL LOW (ref 3.5–5.1)
Sodium: 142 meq/L (ref 135–145)
Total Bilirubin: 0.5 mg/dL (ref 0.2–1.2)
Total Protein: 6.6 g/dL (ref 6.0–8.3)

## 2024-03-09 LAB — LIPID PANEL
Cholesterol: 246 mg/dL — ABNORMAL HIGH (ref 0–200)
HDL: 38.6 mg/dL — ABNORMAL LOW (ref >39.00)
LDL Cholesterol: 164 mg/dL — ABNORMAL HIGH (ref 0–99)
NonHDL: 207.82
Total CHOL/HDL Ratio: 6
Triglycerides: 218 mg/dL — ABNORMAL HIGH (ref 0.0–149.0)
VLDL: 43.6 mg/dL — ABNORMAL HIGH (ref 0.0–40.0)

## 2024-03-12 ENCOUNTER — Ambulatory Visit: Payer: Self-pay | Admitting: Family Medicine

## 2024-03-14 ENCOUNTER — Ambulatory Visit (INDEPENDENT_AMBULATORY_CARE_PROVIDER_SITE_OTHER): Payer: PRIVATE HEALTH INSURANCE | Admitting: Family Medicine

## 2024-03-14 ENCOUNTER — Encounter: Payer: Self-pay | Admitting: Family Medicine

## 2024-03-14 VITALS — BP 138/84 | HR 79 | Temp 97.8°F | Ht 74.41 in | Wt 275.6 lb

## 2024-03-14 DIAGNOSIS — F17200 Nicotine dependence, unspecified, uncomplicated: Secondary | ICD-10-CM

## 2024-03-14 DIAGNOSIS — Z7189 Other specified counseling: Secondary | ICD-10-CM

## 2024-03-14 DIAGNOSIS — I10 Essential (primary) hypertension: Secondary | ICD-10-CM

## 2024-03-14 DIAGNOSIS — Z0000 Encounter for general adult medical examination without abnormal findings: Secondary | ICD-10-CM | POA: Diagnosis not present

## 2024-03-14 MED ORDER — HYDROCHLOROTHIAZIDE 25 MG PO TABS: 25.0000 mg | ORAL_TABLET | Freq: Every day | ORAL | 3 refills | Status: AC

## 2024-03-14 MED ORDER — BUPROPION HCL ER (SR) 100 MG PO TB12: 100.0000 mg | ORAL_TABLET | Freq: Every day | ORAL | 3 refills | Status: AC

## 2024-03-14 MED ORDER — LISINOPRIL 40 MG PO TABS: 40.0000 mg | ORAL_TABLET | Freq: Every day | ORAL | 3 refills | Status: AC

## 2024-03-14 NOTE — Progress Notes (Unsigned)
 CPE- See plan.  Routine anticipatory guidance given to patient.  See health maintenance.  The possibility exists that previously documented standard health maintenance information may have been brought forward from a previous encounter into this note.  If needed, that same information has been updated to reflect the current situation based on today's encounter.    Tetanus 2022  Flu yearly.   PNA and shingles shot not due.  covid vaccine done 2021.   Prostate cancer screening not due.  Colonoscopy 2024 Diet and exercise d/w pt.   Living will d/w pt. Wife would be designated if patient were incapacitated.   Still on wellbutrin .  Smoking less.  No ADE on med.    Hypertension:    Using medication without problems or lightheadedness: yes Chest pain with exertion:no Edema:no Short of breath:no Labs d/w pt.    PMH and SH reviewed  Meds, vitals, and allergies reviewed.   ROS: Per HPI.  Unless specifically indicated otherwise in HPI, the patient denies:  General: fever. Eyes: acute vision changes ENT: sore throat Cardiovascular: chest pain Respiratory: SOB GI: vomiting GU: dysuria Musculoskeletal: acute back pain Derm: acute rash Neuro: acute motor dysfunction Psych: worsening mood Endocrine: polydipsia Heme: bleeding Allergy: hayfever  GEN: nad, alert and oriented HEENT: mucous membranes moist NECK: supple w/o LA CV: rrr. PULM: ctab, no inc wob ABD: soft, +bs EXT: no edema SKIN: no acute rash  The 10-year ASCVD risk score (Arnett DK, et al., 2019) is: 16.6%   Values used to calculate the score:     Age: 48 years     Clincally relevant sex: Male     Is Non-Hispanic African American: No     Diabetic: No     Tobacco smoker: Yes     Systolic Blood Pressure: 138 mmHg     Is BP treated: Yes     HDL Cholesterol: 38.6 mg/dL     Total Cholesterol: 246 mg/dL

## 2024-03-14 NOTE — Patient Instructions (Addendum)
 Let me know if you don't get a call about the cardiac CT.  Take care.  Glad to see you.

## 2024-03-15 NOTE — Assessment & Plan Note (Signed)
Tetanus 2022  Flu yearly.   PNA and shingles shot not due.  covid vaccine done 2021.   Prostate cancer screening not due.  Colonoscopy 2024 Diet and exercise d/w pt.   Living will d/w pt. Wife would be designated if patient were incapacitated.

## 2024-03-15 NOTE — Assessment & Plan Note (Signed)
 No change in meds.  Continue work on diet and exercise.  Continue lisinopril  hydrochlorothiazide .  Discussed cardiac scoring CT given his overall risk.  Rationale for screening discussed, he opted in, ordered.

## 2024-03-15 NOTE — Assessment & Plan Note (Signed)
  Living will d/w pt.  Wife would be designated if patient were incapacitated.   

## 2024-03-15 NOTE — Assessment & Plan Note (Signed)
 Still on wellbutrin .  Smoking less.  No ADE on med.   Discussed smoking cessation.

## 2024-04-07 ENCOUNTER — Ambulatory Visit
Admission: RE | Admit: 2024-04-07 | Discharge: 2024-04-07 | Disposition: A | Discharge status: Home / Self Care | Payer: PRIVATE HEALTH INSURANCE | Source: Ambulatory Visit | Attending: Family Medicine | Admitting: Family Medicine

## 2024-04-07 DIAGNOSIS — I10 Essential (primary) hypertension: Secondary | ICD-10-CM | POA: Insufficient documentation

## 2024-04-10 ENCOUNTER — Ambulatory Visit: Payer: Self-pay | Admitting: Family Medicine

## 2024-09-12 ENCOUNTER — Ambulatory Visit: Admitting: Family Medicine

## 2024-09-15 ENCOUNTER — Encounter: Payer: Self-pay | Admitting: Family Medicine

## 2024-09-15 ENCOUNTER — Ambulatory Visit: Admitting: Family Medicine

## 2024-09-15 ENCOUNTER — Ambulatory Visit: Admission: RE | Admit: 2024-09-15 | Source: Ambulatory Visit

## 2024-09-15 VITALS — BP 162/82 | HR 82 | Temp 98.4°F | Ht 74.0 in | Wt 273.0 lb

## 2024-09-15 DIAGNOSIS — M25562 Pain in left knee: Secondary | ICD-10-CM

## 2024-09-15 NOTE — Progress Notes (Unsigned)
 L knee pain, comes and goes.  Will flare episodically.  Pedal drive kayaking helps with pain.  More pain in the winter.  Walking at work.  More pain with stairs.  No pain with sitting at OV but pain with crossing L leg over the R leg. Medial and lateral pain.  No hip or ankle pain.  No locking. Some knee clicking, longstanding.  No redness, not puffy, not bruised.  No specific injury.  Icing helps.  Voltaren gel didn't help much.    Meds, vitals, and allergies reviewed.   ROS: Per HPI unless specifically indicated in ROS section   Nad Ncat Neck supple, no LA Rrr Ctab Abd soft, not ttp L hip abductors normal strength.   Normal L knee exam.

## 2024-09-15 NOTE — Patient Instructions (Addendum)
 If your BP stays above 140/90, then let me know.  Take care.  Glad to see you.  Xray on the way out.  Try an OTC neoprene sleeve during the day.  Ice as needed.  If needed, take 1-2 OTC ibuprofen with food per day.  If needed recurrently, let me know.
